# Patient Record
Sex: Male | Born: 1980 | Race: White | Hispanic: No | Smoking: Current every day smoker
Health system: Southern US, Community
[De-identification: ages and names within clinical notes are randomized; demographics above are authoritative.]

## PROBLEM LIST (undated history)

## (undated) DIAGNOSIS — F17200 Nicotine dependence, unspecified, uncomplicated: Secondary | ICD-10-CM

## (undated) DIAGNOSIS — L409 Psoriasis, unspecified: Secondary | ICD-10-CM

## (undated) DIAGNOSIS — I10 Essential (primary) hypertension: Secondary | ICD-10-CM

## (undated) DIAGNOSIS — L0211 Cutaneous abscess of neck: Secondary | ICD-10-CM

## (undated) HISTORY — PX: APPENDECTOMY: SHX54

## (undated) HISTORY — DX: Cutaneous abscess of neck: L02.11

---

## 2005-10-06 ENCOUNTER — Other Ambulatory Visit: Payer: Self-pay

## 2005-10-06 ENCOUNTER — Emergency Department: Payer: Self-pay | Admitting: Emergency Medicine

## 2005-10-06 ENCOUNTER — Ambulatory Visit: Payer: Self-pay | Admitting: Emergency Medicine

## 2006-04-23 ENCOUNTER — Emergency Department: Payer: Self-pay | Admitting: Emergency Medicine

## 2006-06-06 ENCOUNTER — Emergency Department: Payer: Self-pay | Admitting: General Practice

## 2007-06-14 ENCOUNTER — Ambulatory Visit: Payer: Self-pay

## 2008-08-01 ENCOUNTER — Ambulatory Visit: Payer: Self-pay | Admitting: Family Medicine

## 2009-08-02 IMAGING — CR DG LUMBAR SPINE COMPLETE 4+V
1 series · 5 of 5 positions shown · non-contrast
Comparison: none

REASON FOR EXAM: pain without trauma
COMMENTS:

[Series 1: view not recorded · 0.17mm/px · 5 of 5 slices shown]
[im 1/5]
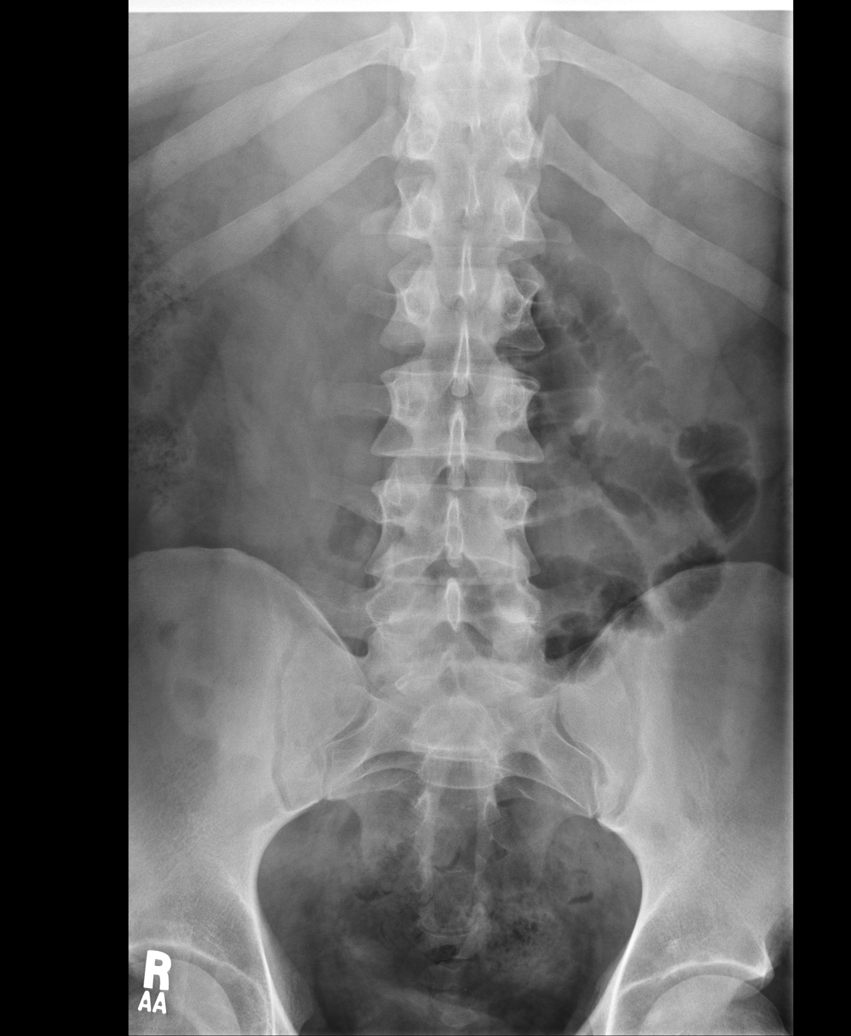
[im 2/5]
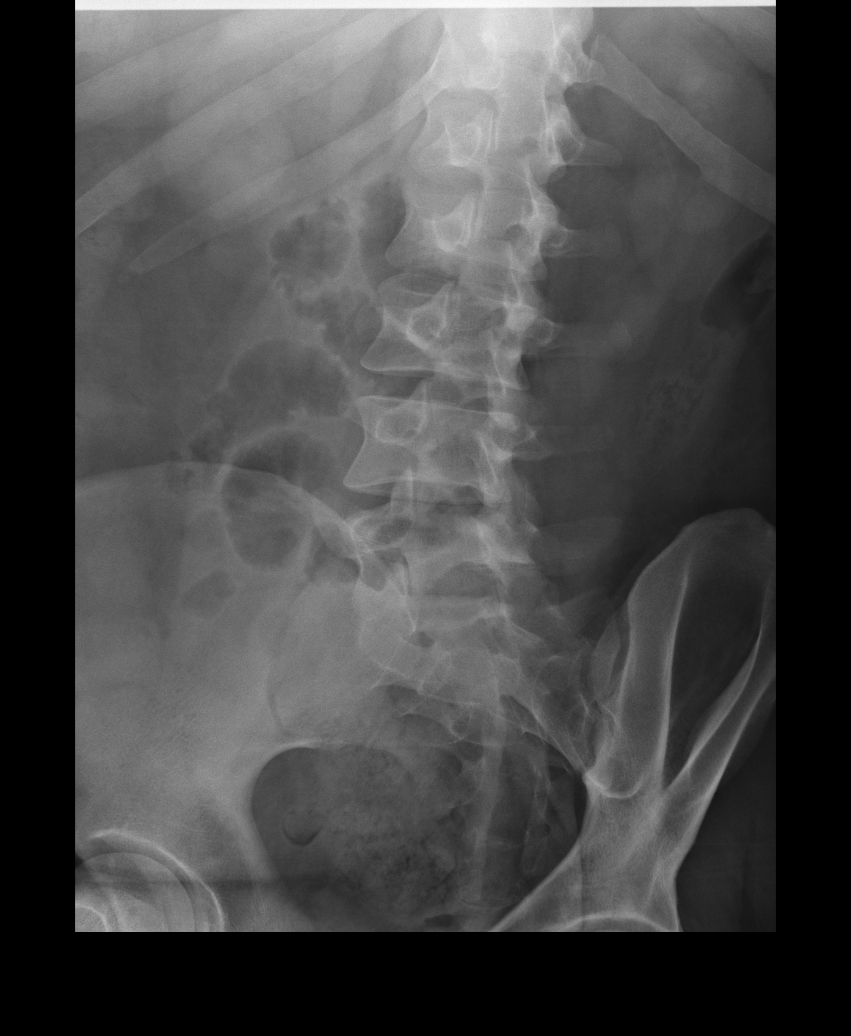
[im 3/5]
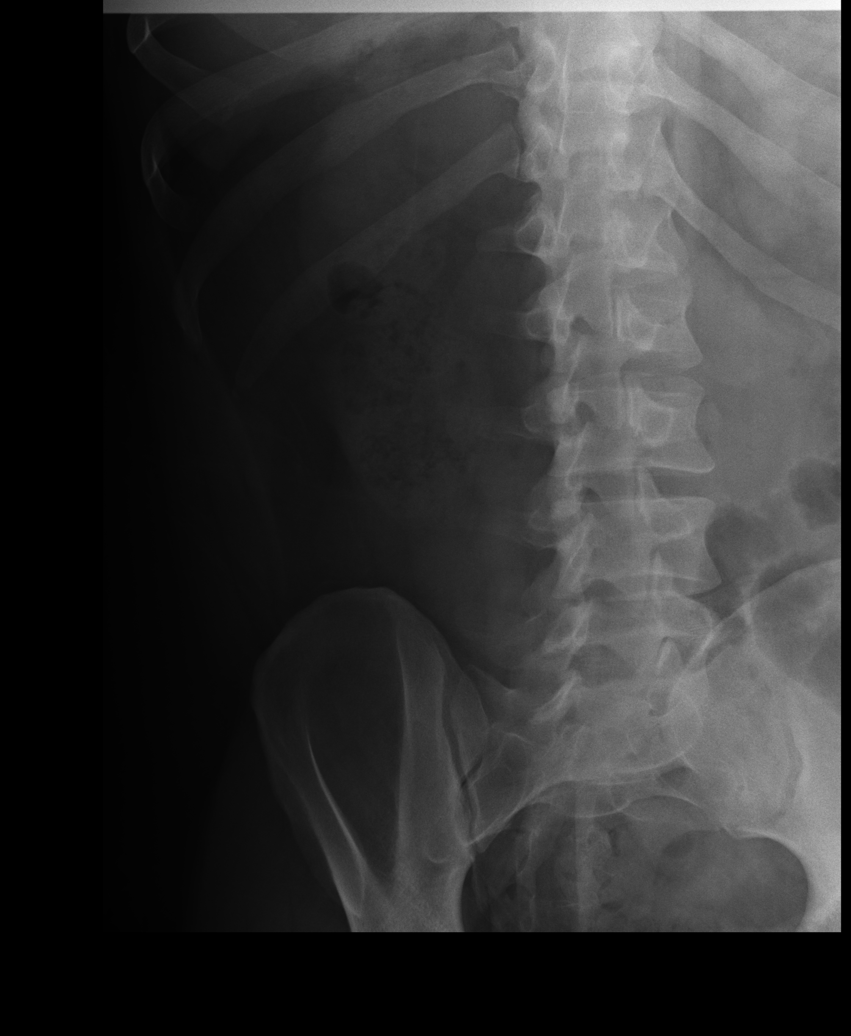
[im 4/5]
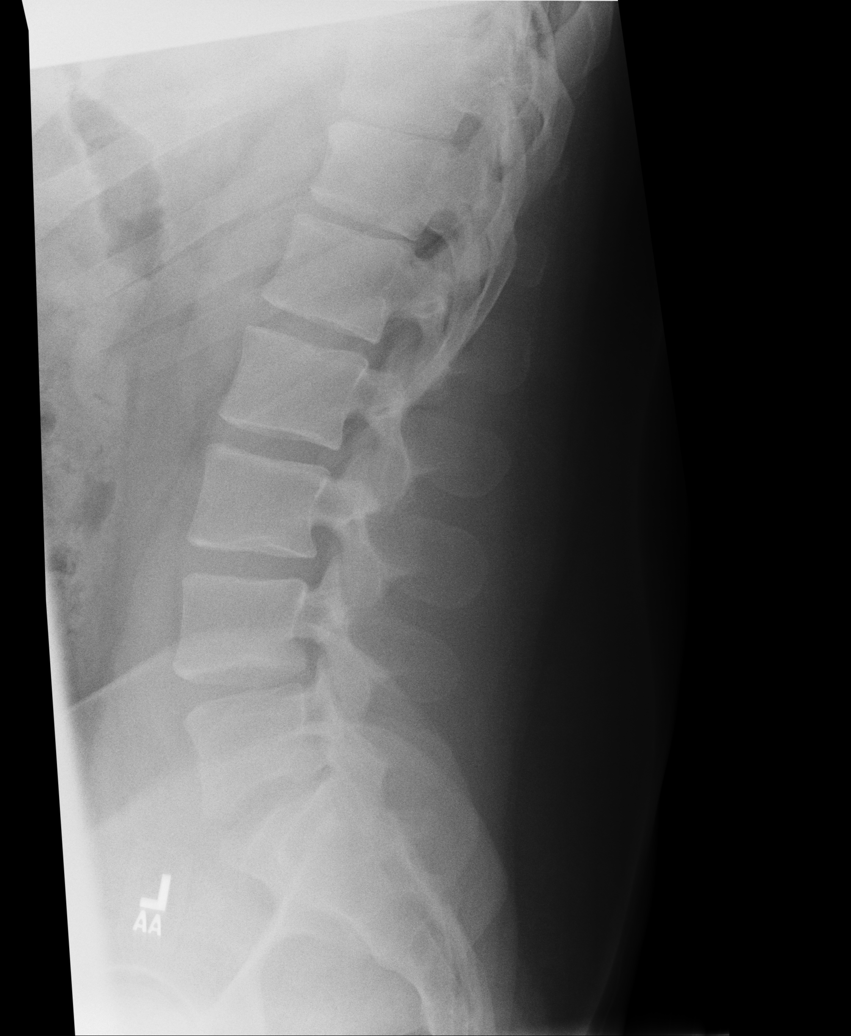
[im 5/5]
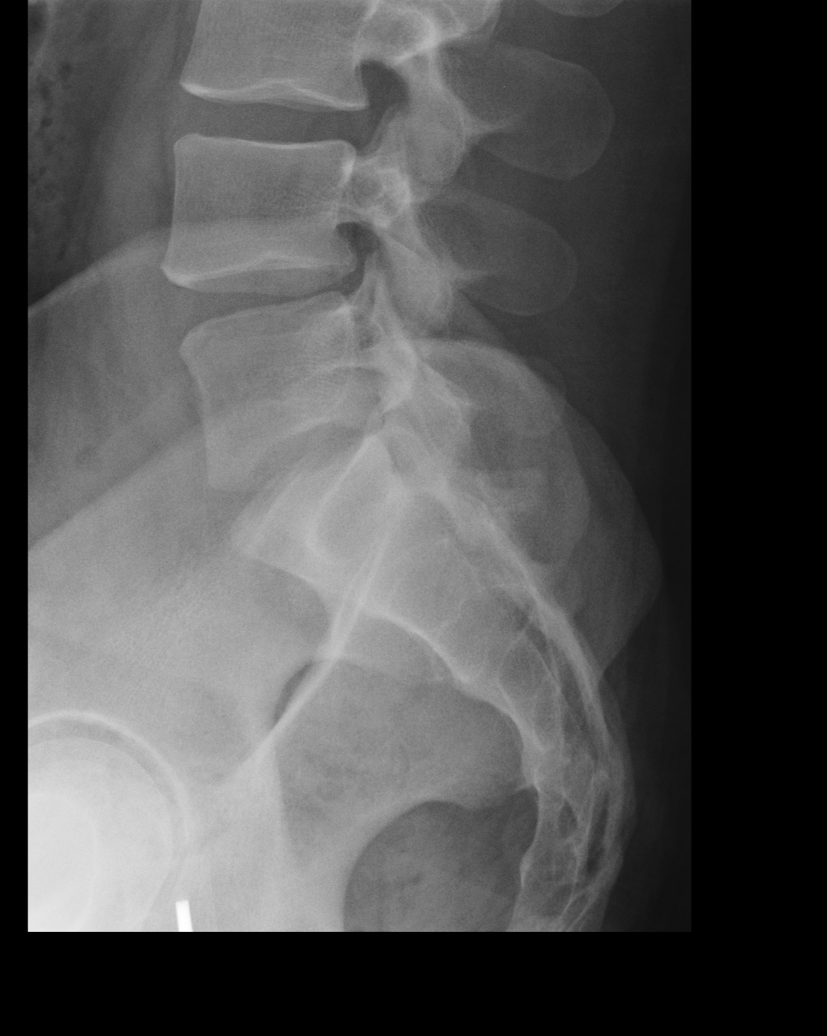

[5 of 5 positions shown; findings below may reference images not displayed]

PROCEDURE:     MDR - M[REDACTED] SPINE WITH OBLIQUES  - August 01, 2008 [DATE]

RESULT:     The vertebral body heights are well maintained. No fracture is
seen. The vertebral body alignment is normal. There is slight narrowing of
the L4-L5 intervertebral disc space. No associated spur formation or
sclerosis of the adjacent vertebral plates is seen. The narrowing could
either be developmental or secondary to early manifestation of disc disease.
This could be further evaluated by MR if such is clinically indicated.
Oblique views show no significant abnormalities of the articular facets. The
pedicles are bilaterally intact.
IMPRESSION: 1. No fracture or other acute bony abnormality is seen.
2. There is slight nonspecific narrowing of the L4-L5 intervertebral disc
space as noted above.

## 2010-01-19 ENCOUNTER — Ambulatory Visit: Payer: Self-pay | Admitting: Family Medicine

## 2011-04-05 ENCOUNTER — Emergency Department: Payer: Self-pay | Admitting: Emergency Medicine

## 2012-01-17 ENCOUNTER — Observation Stay: Payer: Self-pay | Admitting: Internal Medicine

## 2012-01-17 LAB — MAGNESIUM: Magnesium: 1.8 mg/dL

## 2012-01-17 LAB — COMPREHENSIVE METABOLIC PANEL
Alkaline Phosphatase: 92 U/L (ref 50–136)
Anion Gap: 5 — ABNORMAL LOW (ref 7–16)
BUN: 14 mg/dL (ref 7–18)
Bilirubin,Total: 0.2 mg/dL (ref 0.2–1.0)
Calcium, Total: 9.3 mg/dL (ref 8.5–10.1)
Chloride: 106 mmol/L (ref 98–107)
Co2: 30 mmol/L (ref 21–32)
EGFR (Non-African Amer.): >60
Osmolality: 283 (ref 275–301)
SGOT(AST): 23 U/L (ref 15–37)
Sodium: 141 mmol/L (ref 136–145)

## 2012-01-17 LAB — HEMOGLOBIN A1C: Hemoglobin A1C: 6 % (ref 4.2–6.3)

## 2012-01-17 LAB — CBC
HCT: 47.4 % (ref 40.0–52.0)
MCHC: 33.4 g/dL (ref 32.0–36.0)
MCV: 85 fL (ref 80–100)
Platelet: 321 10*3/uL (ref 150–440)
RBC: 5.61 10*6/uL (ref 4.40–5.90)
RDW: 14.7 % — ABNORMAL HIGH (ref 11.5–14.5)
WBC: 11.2 10*3/uL — ABNORMAL HIGH (ref 3.8–10.6)

## 2012-01-17 LAB — TSH: Thyroid Stimulating Horm: 1.51 u[IU]/mL

## 2012-01-18 LAB — CBC WITH DIFFERENTIAL/PLATELET
Basophil %: 0.4 %
Eosinophil %: 3.3 %
HGB: 15 g/dL (ref 13.0–18.0)
Lymphocyte #: 4.1 10*3/uL — ABNORMAL HIGH (ref 1.0–3.6)
MCH: 28.1 pg (ref 26.0–34.0)
MCV: 84 fL (ref 80–100)
Monocyte #: 1 10*3/uL — ABNORMAL HIGH (ref 0.0–0.7)
Monocyte %: 8.5 %
Neutrophil #: 6.5 10*3/uL (ref 1.4–6.5)
Neutrophil %: 53.7 %
RBC: 5.34 10*6/uL (ref 4.40–5.90)
WBC: 12.1 10*3/uL — ABNORMAL HIGH (ref 3.8–10.6)

## 2012-01-18 LAB — BASIC METABOLIC PANEL
Anion Gap: 12 (ref 7–16)
Calcium, Total: 8.9 mg/dL (ref 8.5–10.1)
Chloride: 108 mmol/L — ABNORMAL HIGH (ref 98–107)
Co2: 25 mmol/L (ref 21–32)
Creatinine: 1.2 mg/dL (ref 0.60–1.30)
EGFR (African American): >60
Osmolality: 290 (ref 275–301)

## 2012-01-18 LAB — LIPID PANEL
Cholesterol: 176 mg/dL (ref 0–200)
HDL Cholesterol: 19 mg/dL — ABNORMAL LOW (ref 40–60)
Ldl Cholesterol, Calc: 110 mg/dL — ABNORMAL HIGH (ref 0–100)
Triglycerides: 236 mg/dL — ABNORMAL HIGH (ref 0–200)

## 2012-01-18 LAB — CK-MB: CK-MB: 0.9 ng/mL (ref 0.5–3.6)

## 2012-12-09 ENCOUNTER — Emergency Department: Payer: Self-pay | Admitting: Emergency Medicine

## 2012-12-13 ENCOUNTER — Ambulatory Visit: Payer: Self-pay

## 2012-12-13 LAB — RAPID INFLUENZA A&B ANTIGENS

## 2013-01-17 IMAGING — CR DG CHEST 1V PORT
1 series · 1 of 1 positions shown · non-contrast
Comparison: none

REASON FOR EXAM: cp
COMMENTS:

PROCEDURE:     DXR - DXR PORTABLE CHEST SINGLE VIEW  - January 17, 2012  [DATE]
RESULT:     The lungs are mildly hypoinflated. There is no focal infiltrate.
The cardiac silhouette is top normal in size. The pulmonary vascularity is
not engorged. The mediastinum does not appear abnormally widened.

[portable]
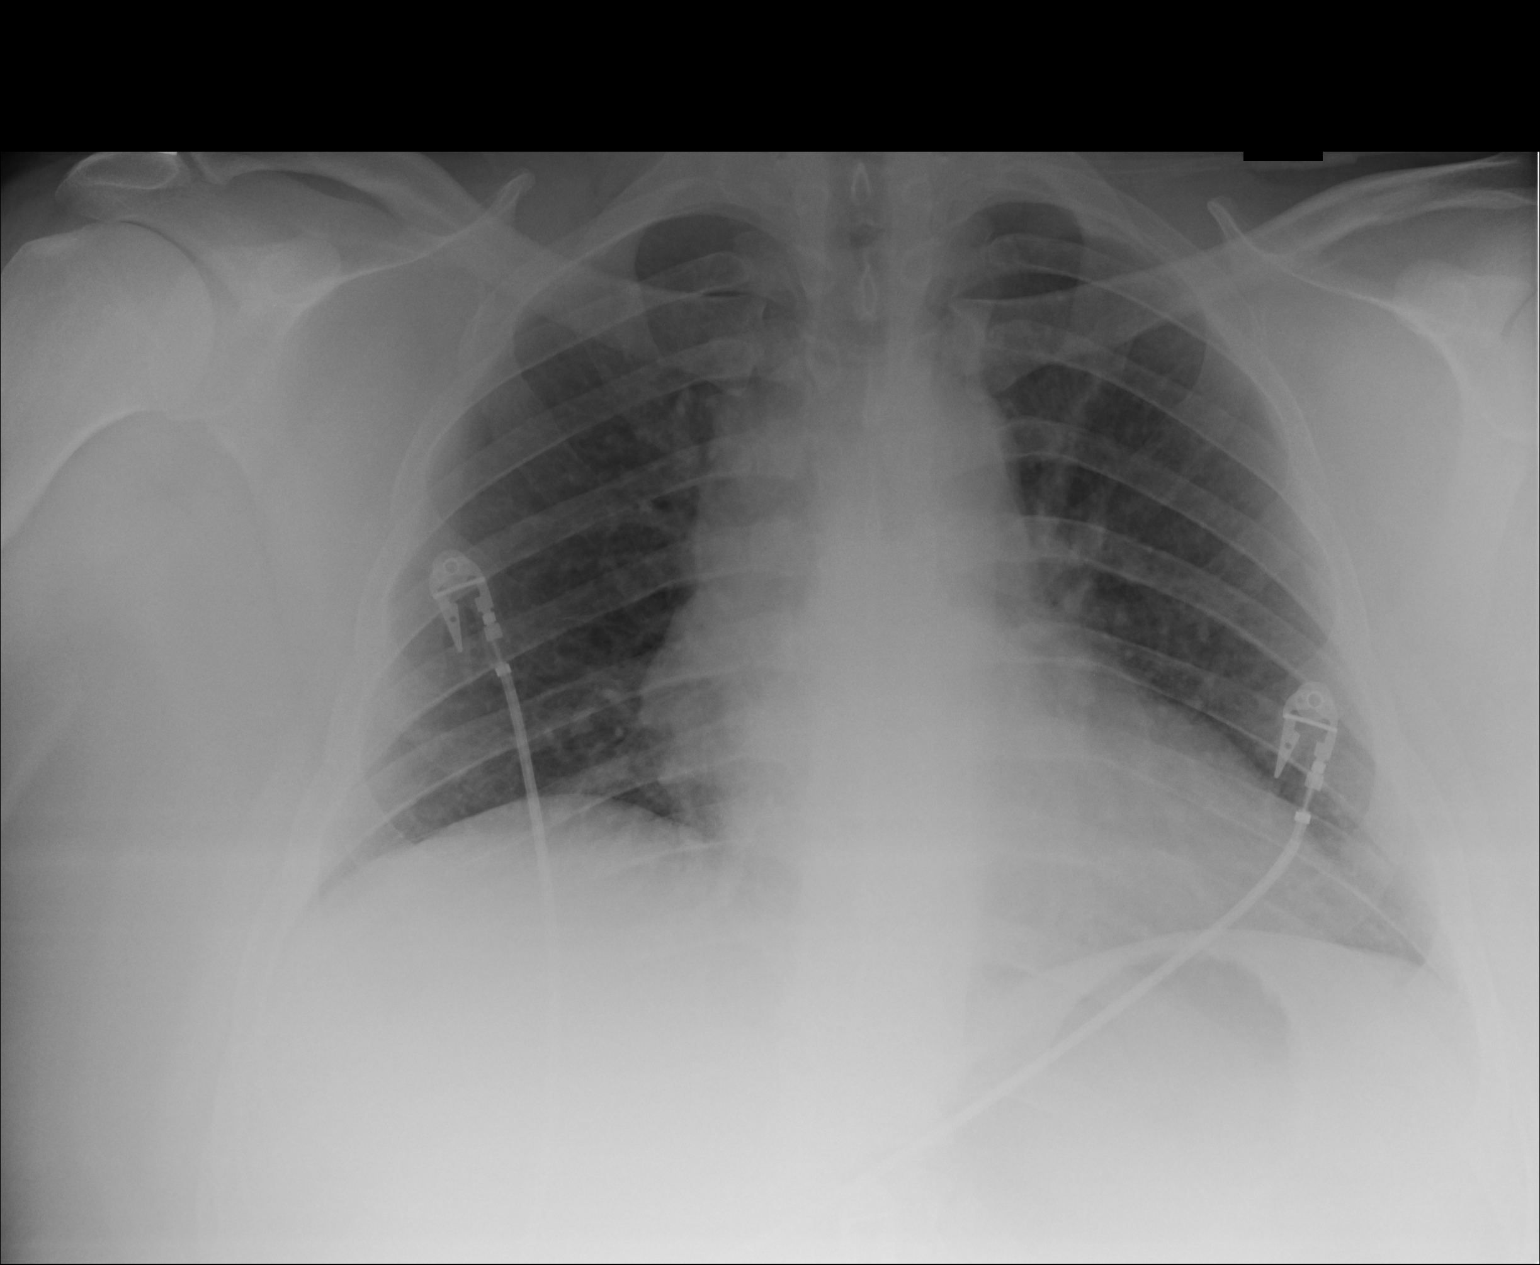

[1 of 1 positions shown; findings below may reference images not displayed]

IMPRESSION: There is mild pulmonary hypoinflation. I do not see
definite evidence of pulmonary edema or other acute cardiopulmonary
abnormality. A followup PA and lateral chest x-ray would be of value if the
patient's symptoms persist.

## 2013-12-14 IMAGING — CR DG CHEST 2V
1 series · 2 of 2 positions shown · non-contrast
Comparison: none

REASON FOR EXAM: cough for 2 weeks
COMMENTS:

[Series 1: pa · 0.17mm/px · 2 of 2 slices shown]
[im 1/2]
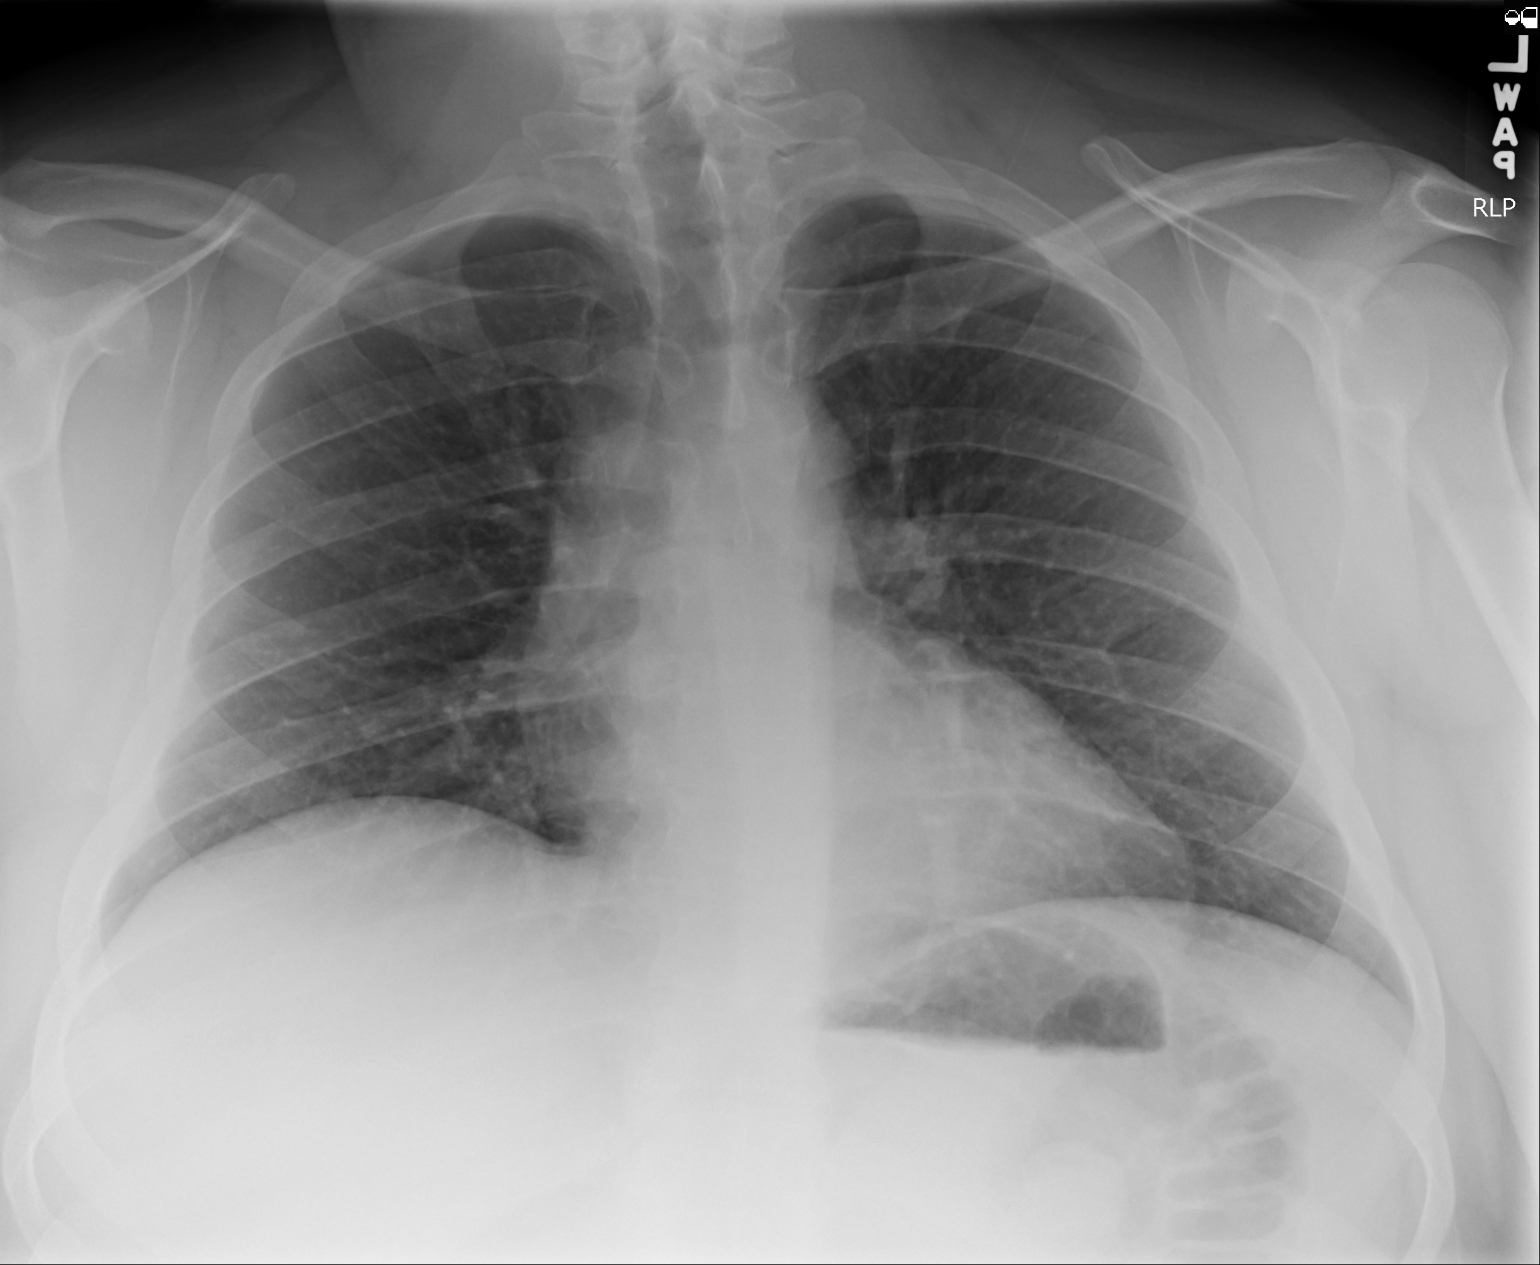
[im 2/2]
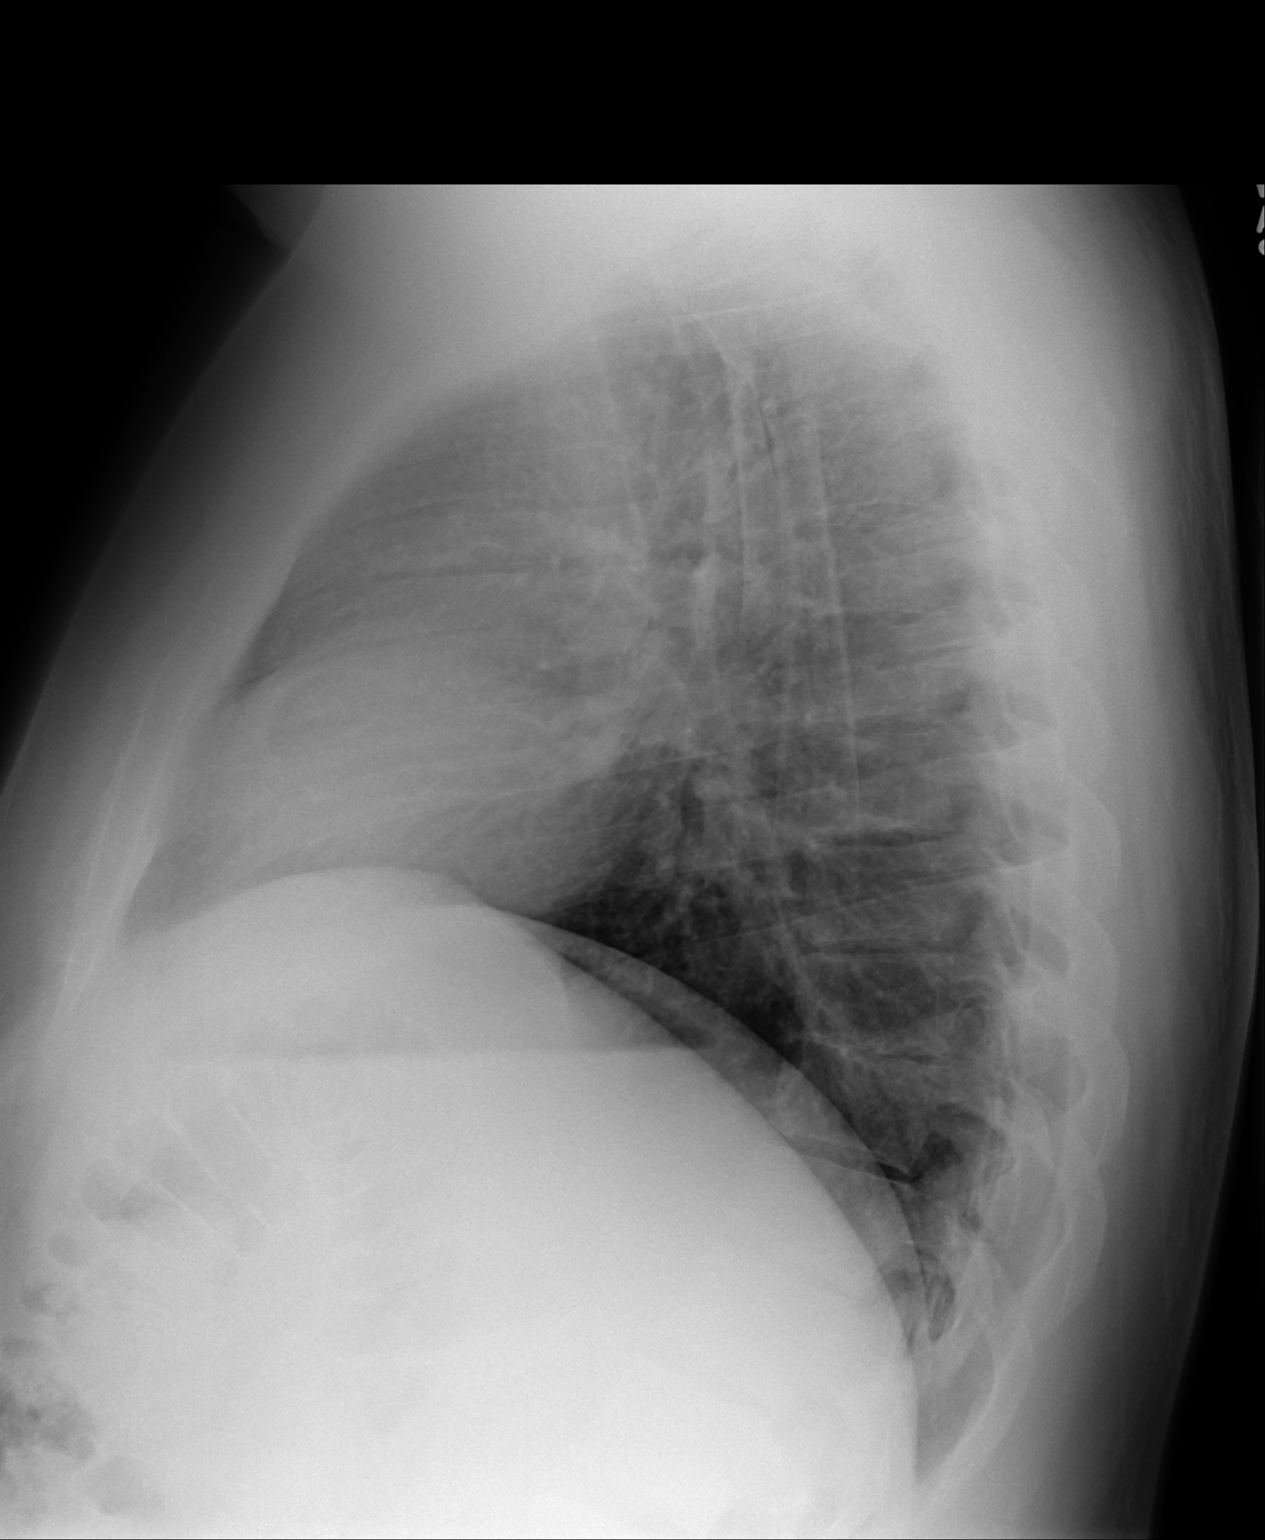

[2 of 2 positions shown; findings below may reference images not displayed]

PROCEDURE:     MDR - MDR CHEST PA(OR AP) AND LATERAL  - December 13, 2012 [DATE]

RESULT:     Comparison is made to the study 17 January, 2012. The lungs
are clear. The heart and pulmonary vessels are normal. The bony and
mediastinal structures are unremarkable. There is no effusion. There is no
pneumothorax or evidence of congestive failure.
IMPRESSION: No acute cardiopulmonary disease. Stable appearance.

[REDACTED]

## 2015-02-05 ENCOUNTER — Ambulatory Visit: Payer: Self-pay | Admitting: Physician Assistant

## 2015-03-15 NOTE — H&P (Signed)
PATIENT NAME:  Brandon Hebert, Brandon Hebert MR#:  [id number] DATE OF BIRTH:  09-20-1981  DATE OF ADMISSION:  01/17/2012  PRIMARY CARE PHYSICIAN: None   HISTORY OF PRESENT ILLNESS: Patient is a 34 year old male with past medical history significant for history of cervical vertebral fracture in the past, history of appendectomy, history of hiatal hernia presented to the hospital with complaints of chest pain. According to patient he was doing well up until 2:00 p.m. on the day of admission when he was sitting and eating his lunch, suddenly started having midsternal chest pain which was described as burning intermittent pain with no radiation, increasing whenever he walked around and accompanied by shortness of breath. He denied any other symptoms, however, his pain lasted a few minutes and it would ease off and would increase again whenever he tried to walk. On arrival to the Emergency Room he was noted to have normal cardiac enzymes. His EKG was also unremarkable but because of his pain seemed to be intermittent and worsening as well as his family history of coronary artery disease hospitalist services were contacted for admission. Pain was described as pressure-type pain.  PAST MEDICAL HISTORY: 1. History of hiatal hernia. 2. Cervical vertebral fracture in the past. 3. History of appendectomy. 4. History of left lower back pain 07/2008.   MEDICATIONS: None.   PAST SURGICAL HISTORY: None.   ALLERGIES: Codeine.   FAMILY HISTORY: Significant for maternal uncle who had myocardial infarction at age of 72. Patient's mother has hypertension as well as hyperlipidemia. Both parents have asthma.   SOCIAL HISTORY: Patient is single, has no children. He used to smoke since age 58, smokes approximately half pack per day, quit two years ago, however, restarted approximately a month ago. Drinks alcohol on weekends. He works in Personal assistant.   REVIEW OF SYSTEMS: CONSTITUTIONAL: Positive for pains in his chest, snoring as  well as sleepiness in daytime, some cough with no sputum production as well as dyspnea today and chest pains earlier today. Denies any fevers, chills, fatigue, weakness, weight loss or gain. EYES: In regards to eyes, denies any blurry vision, double vision, glaucoma, o cataracts. ENT: Denies any tinnitus, allergies, epistaxis, sinus pain, dentures, difficulty swallowing. RESPIRATORY: Denies any wheezes, asthma, chronic obstructive pulmonary disease. CARDIOVASCULAR: Denies any orthopnea, edema, arrhythmias, palpitations, or syncope. GASTROINTESTINAL: Denies nausea, vomiting, diarrhea, constipation. GENITOURINARY: Denies dysuria, hematuria, frequency, incontinence. ENDOCRINOLOGY: Denies any polydipsia, nocturia, thyroid problems, heat or cold intolerance or thirst. HEMATOLOGIC: Denies anemia, easy bruising, bleeding, swollen glands. SKIN: Denies any acne, rashes, lesions, change in moles. MUSCULOSKELETAL: Denies arthritis, cramps, swelling, gout. NEUROLOGICAL: Denies any numbness, epilepsy, tremor. PSYCH: Denies anxiety, insomnia, depression.   PHYSICAL EXAMINATION:  VITAL SIGNS: On arrival to hospital: Temperature 98.2, pulse 90, respiration rate 19, blood pressure 147/80, saturation 96% on room air.   GENERAL: This is a well-developed, obese male sitting on the stretcher. He is very somnolent and are not able to communicate with me well. He drifts periodically to sleep and I have to shake him up or talk to him louder so he could wake up.  HEENT: Pupils are equal, reactive to light. Extraocular movements are intact. No icterus or conjunctivitis. Has normal hearing. No pharyngeal edema. Mucosa is moist.   NECK: Neck did not reveal any masses. Supple, nontender. Thyroid not enlarged. No adenopathy. No JVD or carotid bruits bilaterally. Full range of motion.   LUNGS: Clear to auscultation in all fields. No rales, rhonchi, diminished breath sounds or wheezing. No labored inspirations,  dullness to percussion,  overt respiratory distress.   CARDIOVASCULAR: S1, S2 appreciated. No murmurs, gallops, rubs noted. PMI not lateralized. Chest is nontender to palpation.    EXTREMITIES: 1+ pedal pulses. No lower extremity edema, calf tenderness, or cyanosis.   ABDOMEN: Soft, nontender. Bowel sounds are present. No hepatosplenomegaly or masses were noted.   RECTAL: Deferred.   MUSCULOSKELETAL: Able to move all extremities. No cyanosis, degenerative joint disease, or kyphosis. Gait is not tested.   SKIN: Skin did not reveal any rashes, lesions, erythema, nodularity, induration. It was warm and dry to palpation.   LYMPH: No adenopathy in cervical region.   NEUROLOGICAL: Cranial nerves grossly intact. Sensory is intact. No dysarthria, aphasia.   PSYCH: Patient is alert, oriented to person, place. Poorly cooperative due to significant somnolence. Memory is not impaired but no significant confusion, agitation, depression.   LABORATORY, DIAGNOSTIC AND RADIOLOGICAL DATA: BMP showed glucose 113, potassium 3.4, otherwise unremarkable study. Liver enzymes were normal. Cardiac enzymes troponin less than 0.02. TSH normal at 1.51. White blood cell count is elevated to 11.2, hemoglobin 15.8, platelets 321. D-dimer less than 0.22. EKG showed normal sinus rhythm at 89 beats per minute, normal axis, no acute ST-T changes.   ASSESSMENT AND PLAN:  1. Chest pain. Admit patient to medical floor observation. Start him on metoprolol, nitroglycerin, aspirin as well as heparin subcutaneous. Check cardiac enzymes x3 and get Myoview stress test in the morning.  2. Obesity. Check TSH which was unremarkable.  3. Lipid profile for the morning and patient would benefit from outpatient sleep study to rule out obstructive sleep apnea.  4. Hypokalemia. Supplementing p.o. Check magnesium level.  5. Hyperglycemia. Get hemoglobin A1c.  6. Leukocytosis, likely stress related.  7. Elevated blood pressure without diagnosis of hypertension. Will  continue patient on metoprolol as well as nitroglycerin topically while in the hospital. Patient may benefit from outpatient blood pressure medications as well as 2 grams salt diet.      TIME SPENT: 50 minutes.   ____________________________ Katharina Caperima Tamaya Pun, MD rv:cms D: 01/17/2012 20:21:12 ET T: 01/18/2012 06:04:27 ET JOB#: [id number]  cc: Katharina Caperima Bianca Raneri, MD, <Dictator> Milicent Acheampong MD ELECTRONICALLY SIGNED 01/18/2012 14:32

## 2015-03-15 NOTE — Discharge Summary (Signed)
PATIENT NAME:  Brandon Hebert, Brandon Hebert MR#:  [id number] DATE OF BIRTH:  31-Oct-1981   DATE OF ADMISSION:  01/17/2012 DATE OF DISCHARGE:  01/18/2012 (AGAINST MEDICAL ADVICE)   PRIMARY CARE PHYSICIAN: None.   REASON FOR ADMISSION: Chest pain.   DIAGNOSES: (At the time the patient left the hospital AGAINST MEDICAL ADVICE)  1. Chest pain of unclear etiology. The patient left the hospital AGAINST MEDICAL ADVICE before the second part of his stress test could be performed or completed.  2. Hiatal hernia.  3. Possible gastroesophageal reflux disease.  4. Hypercholesterolemia/Hypertriglyceridemia.  5. Tobacco abuse.  6. Obesity.  7. Possible obstructive sleep apnea.  8. History of appendectomy. 9. History of cervical vertebral fracture in past. 10. History of left lower back pain in September 2009. 11. Hypokalemia, resolved.  12. Leukocytosis of unclear etiology.   CONSULTATIONS: None.   LABORATORY, DIAGNOSTIC AND RADIOLOGICAL DATA:  Portable chest x-ray on 01/17/2012: Mild pulmonary hypoinflation. No evidence of acute cardiopulmonary abnormalities are noted.  Preliminary report from Part 1 of the patient's treadmill Myoview: Negative ETT with fair exercise tolerance. LV function mildly reduced at 47% with borderline septal hypokinesis. Stress images revealed no areas of hypoperfusion. Rest images were not done secondary to the patient leaving the hospital AGAINST MEDICAL ADVICE.   EKG on admission: Normal sinus rhythm. Normal heart rate, normal intervals, normal axis. No acute ST or T wave changes.  D-dimer less than 0.22.  Cardiac enzymes are negative x3 sets.  TSH 1.51 on admission.  CBC in admission: WBC 11.3, hemoglobin 15.8, hematocrit 47.4, platelets 321.  LFTs normal on admission.  Renal function normal on admission and discharge. Discharge creatinine 1.20.  Serum potassium 3.4 on admission and 4 on the day of discharge.   BRIEF HISTORY/HOSPITAL COURSE: The patient is a 34 year old obese  male with past medical history of cervical vertebral fracture back pain, tobacco abuse, obesity, hiatal hernia, presents to the Emergency Department  with complaints of chest pain. Please see the dictated History and Physical for pertinent details surrounding the onset of hospitalization. Please see below for further details.   1. Chest pain: Atypical in nature which came on with eating, described as a burning sensation. The patient was admitted to the Observation Unit for further evaluation of his chest pain. He was initially started on nitroglycerin and aspirin therapy. First troponin was negative. He was also given some pain control. With conservative measures, the patient's chest pain had eventually resolved. He has ruled out for a myocardial infarction based on three negative sets of cardiac enzymes. Thereafter, he was scheduled for a Myoview. The patient underwent Part 1 of the Myoview, and given his body habitus the second part could not be completed until the next day. However, the patient decided to leave the hospital AGAINST MEDICAL ADVICE prior to having the second part of the Myoview performed. The exact etiology of his chest pain is unclear. It could be related to his hiatal hernia and possible gastroesophageal reflux disease as it came on with eating; and I had advised him before he left the hospital AGAINST MEDICAL ADVICE that he should follow up with Gastroenterology as an outpatient and start proton pump inhibitor therapy.  2. Hypercholesterolemia/hypertriglyceridemia: The patient wished to start a trial of diet and exercise control before being started on medications.  3. Tobacco abuse: The patient was strongly counseled on the importance of smoking cessation. 4. Leukocytosis: Of unclear etiology, could be stress-related, however, his WBC count slightly went up prior to discharge.  There were no obvious signs of infection. The patient left the hospital AGAINST MEDICAL ADVICE before this could  be further worked up, and the exact etiology of his leukocytosis is unclear at this time whether this is stress-related versus possibly secondary to an underlying infection.  5. Obesity with obstructive sleep apnea: The patient was advised that he would likely benefit from outpatient referral to Pulmonology for a sleep study but left the hospital AGAINST MEDICAL ADVICE prior to having this arranged.     On 01/18/2012, the patient left the hospital AGAINST MEDICAL ADVICE.    ____________________________ Elon AlasKamran N. Bricia Taher, MD knl:cbb D: 01/21/2012 16:50:35 ET T: 01/22/2012 14:42:19 ET JOB#: [id number]  cc: Elon AlasKamran N. Carlye Panameno, MD, <Dictator> [facility] Elon AlasKAMRAN N Isiah Scheel MD ELECTRONICALLY SIGNED 01/25/2012 21:06

## 2015-05-07 ENCOUNTER — Encounter: Payer: Self-pay | Admitting: Emergency Medicine

## 2015-05-07 ENCOUNTER — Emergency Department
Admission: EM | Admit: 2015-05-07 | Discharge: 2015-05-07 | Disposition: A | Discharge status: Home / Self Care | Payer: No Typology Code available for payment source | Attending: Emergency Medicine | Admitting: Emergency Medicine

## 2015-05-07 DIAGNOSIS — Y9389 Activity, other specified: Secondary | ICD-10-CM | POA: Diagnosis not present

## 2015-05-07 DIAGNOSIS — Y9289 Other specified places as the place of occurrence of the external cause: Secondary | ICD-10-CM | POA: Diagnosis not present

## 2015-05-07 DIAGNOSIS — Y998 Other external cause status: Secondary | ICD-10-CM | POA: Insufficient documentation

## 2015-05-07 DIAGNOSIS — S86912A Strain of unspecified muscle(s) and tendon(s) at lower leg level, left leg, initial encounter: Secondary | ICD-10-CM | POA: Diagnosis not present

## 2015-05-07 DIAGNOSIS — Z880 Allergy status to penicillin: Secondary | ICD-10-CM | POA: Insufficient documentation

## 2015-05-07 DIAGNOSIS — M25462 Effusion, left knee: Secondary | ICD-10-CM | POA: Insufficient documentation

## 2015-05-07 DIAGNOSIS — Z720 Tobacco use: Secondary | ICD-10-CM | POA: Insufficient documentation

## 2015-05-07 DIAGNOSIS — M25562 Pain in left knee: Secondary | ICD-10-CM | POA: Diagnosis present

## 2015-05-07 DIAGNOSIS — X58XXXA Exposure to other specified factors, initial encounter: Secondary | ICD-10-CM | POA: Insufficient documentation

## 2015-05-07 MED ORDER — KETOROLAC TROMETHAMINE 10 MG PO TABS: 10.0000 mg | ORAL_TABLET | Freq: Three times a day (TID) | ORAL | Status: DC

## 2015-05-07 NOTE — Discharge Instructions (Signed)
Knee Effusion The medical term for having fluid in your knee is effusion. This is often due to an internal derangement of the knee. This means something is wrong inside the knee. Some of the causes of fluid in the knee may be torn cartilage, a torn ligament, or bleeding into the joint from an injury. Your knee is likely more difficult to bend and move. This is often because there is increased pain and pressure in the joint. The time it takes for recovery from a knee effusion depends on different factors, including:   Type of injury.  Your age.  Physical and medical conditions.  Rehabilitation Strategies. How long you will be away from your normal activities will depend on what kind of knee problem you have and how much damage is present. Your knee has two types of cartilage. Articular cartilage covers the bone ends and lets your knee bend and move smoothly. Two menisci, thick pads of cartilage that form a rim inside the joint, help absorb shock and stabilize your knee. Ligaments bind the bones together and support your knee joint. Muscles move the joint, help support your knee, and take stress off the joint itself. CAUSES  Often an effusion in the knee is caused by an injury to one of the menisci. This is often a tear in the cartilage. Recovery after a meniscus injury depends on how much meniscus is damaged and whether you have damaged other knee tissue. Small tears may heal on their own with conservative treatment. Conservative means rest, limited weight bearing activity and muscle strengthening exercises. Your recovery may take up to 6 weeks.  TREATMENT  Larger tears may require surgery. Meniscus injuries may be treated during arthroscopy. Arthroscopy is a procedure in which your surgeon uses a small telescope like instrument to look in your knee. Your caregiver can make a more accurate diagnosis (learning what is wrong) by performing an arthroscopic procedure. If your injury is on the inner margin  of the meniscus, your surgeon may trim the meniscus back to a smooth rim. In other cases your surgeon will try to repair a damaged meniscus with stitches (sutures). This may make rehabilitation take longer, but may provide better long term result by helping your knee keep its shock absorption capabilities. Ligaments which are completely torn usually require surgery for repair. HOME CARE INSTRUCTIONS  Use crutches as instructed.  If a brace is applied, use as directed.  Once you are home, an ice pack applied to your swollen knee may help with discomfort and help decrease swelling.  Keep your knee raised (elevated) when you are not up and around or on crutches.  Only take over-the-counter or prescription medicines for pain, discomfort, or fever as directed by your caregiver.  Your caregivers will help with instructions for rehabilitation of your knee. This often includes strengthening exercises.  You may resume a normal diet and activities as directed. SEEK MEDICAL CARE IF:   There is increased swelling in your knee.  You notice redness, swelling, or increasing pain in your knee.  An unexplained oral temperature above 102 F (38.9 C) develops. SEEK IMMEDIATE MEDICAL CARE IF:   You develop a rash.  You have difficulty breathing.  You have any allergic reactions from medications you may have been given.  There is severe pain with any motion of the knee. MAKE SURE YOU:   Understand these instructions.  Will watch your condition.  Will get help right away if you are not doing well or get worse.  Document Released: 01/28/2004 Document Revised: 01/30/2012 Document Reviewed: 04/02/2008 [facility] Patient Information 2015 [place], [place]. This information is not intended to replace advice given to you by your health care provider. Make sure you discuss any questions you have with your health care provider.  Knee Sprain A knee sprain is a tear in one of the strong, fibrous tissues that  connect the bones (ligaments) in your knee. The severity of the sprain depends on how much of the ligament is torn. The tear can be either partial or complete. CAUSES  Often, sprains are a result of a fall or injury. The force of the impact causes the fibers of your ligament to stretch too much. This excess tension causes the fibers of your ligament to tear. SIGNS AND SYMPTOMS  You may have some loss of motion in your knee. Other symptoms include:  Bruising.  Pain in the knee area.  Tenderness of the knee to the touch.  Swelling. DIAGNOSIS  To diagnose a knee sprain, your health care provider will physically examine your knee. Your health care provider may also suggest an X-ray exam of your knee to make sure no bones are broken. TREATMENT  If your ligament is only partially torn, treatment usually involves keeping the knee in a fixed position (immobilization) or bracing your knee for activities that require movement for several weeks. To do this, your health care provider will apply a bandage, cast, or splint to keep your knee from moving and to support your knee during movement until it heals. For a partially torn ligament, the healing process usually takes 4-6 weeks. If your ligament is completely torn, depending on which ligament it is, you may need surgery to reconnect the ligament to the bone or reconstruct it. After surgery, a cast or splint may be applied and will need to stay on your knee for 4-6 weeks while your ligament heals. HOME CARE INSTRUCTIONS  Keep your injured knee elevated to decrease swelling.  To ease pain and swelling, apply ice to the injured area:  Put ice in a plastic bag.  Place a towel between your skin and the bag.  Leave the ice on for 20 minutes, 2-3 times a day.  Only take medicine for pain as directed by your health care provider.  Do not leave your knee unprotected until pain and stiffness go away (usually 4-6 weeks).  If you have a cast or splint,  do not allow it to get wet. If you have been instructed not to remove it, cover it with a plastic bag when you shower or bathe. Do not swim.  Your health care provider may suggest exercises for you to do during your recovery to prevent or limit permanent weakness and stiffness. SEEK IMMEDIATE MEDICAL CARE IF:  Your cast or splint becomes damaged.  Your pain becomes worse.  You have significant pain, swelling, or numbness below the cast or splint. MAKE SURE YOU:  Understand these instructions.  Will watch your condition.  Will get help right away if you are not doing well or get worse. Document Released: 11/07/2005 Document Revised: 08/28/2013 Document Reviewed: 06/19/2013 [facility] Patient Information 2015 [place], [place]. This information is not intended to replace advice given to you by your health care provider. Make sure you discuss any questions you have with your health care provider.  Rest, elevate, and ice the knee to resolve effusion.  Use the ace bandage as needed for support.  Follow-up with Dr. Ernest Pine for ongoing symptoms.

## 2015-05-07 NOTE — ED Notes (Signed)
States he developed pain to left 2 days ago. Noticed some swelling this am while at work this am . Ambulates with slight limp.  Denies any injury

## 2015-05-07 NOTE — ED Provider Notes (Signed)
[facility] Emergency Department Provider Note ____________________________________________  Time seen: 0713  I have reviewed the triage vital signs and the nursing notes.  HISTORY  Chief Complaint Knee Pain  HPI Brandon Hebert. is a 34 y.o. male reports to the ED with complaints of swelling to the left knee. He describes sudden onset of swelling to the left knee after finishing his third shift last night. He denies known injury or trauma to the knee he works as a Estate agent, and notes that the prolonged sitting and climbing into the equipment is aggravated to his knee. He does describe a previous episode of sudden swelling to the same knee earlier this year, but the symptoms resolved spontaneously in a few days. He rates his pain at a 2 out of 10.  History reviewed. No pertinent past medical history.  There are no active problems to display for this patient.  Past Surgical History  Procedure Laterality Date  . Appendectomy    . Hernia repair     Current Outpatient Rx  Name  Route  Sig  Dispense  Refill  . ketorolac (TORADOL) 10 MG tablet   Oral   Take 1 tablet (10 mg total) by mouth every 8 (eight) hours.   15 tablet   0     Allergies Codeine and Penicillins  No family history on file.  Social History History  Substance Use Topics  . Smoking status: Current Every Day Smoker -- 0.50 packs/day    Types: Cigarettes  . Smokeless tobacco: Not on file  . Alcohol Use: No   Review of Systems  Constitutional: Negative for fever. Eyes: Negative for visual changes. ENT: Negative for sore throat. Cardiovascular: Negative for chest pain. Respiratory: Negative for shortness of breath. Gastrointestinal: Negative for abdominal pain, vomiting and diarrhea. Genitourinary: Negative for dysuria. Musculoskeletal: Negative for back pain. Left knee swelling. Skin: Negative for rash. Neurological: Negative for headaches, focal weakness or  numbness. ____________________________________________  PHYSICAL EXAM:  VITAL SIGNS: ED Triage Vitals  Enc Vitals Group     BP 05/07/15 0701 129/83 mmHg     Pulse Rate 05/07/15 0701 90     Resp 05/07/15 0701 18     Temp 05/07/15 0701 98.1 F (36.7 C)     Temp Source 05/07/15 0701 Oral     SpO2 05/07/15 0701 99 %     Weight 05/07/15 0701 292 lb (132.45 kg)     Height 05/07/15 0701 6\' 4"  (1.93 m)     Head Cir --      Peak Flow --      Pain Score 05/07/15 0658 2     Pain Loc --      Pain Edu? --      Excl. in GC? --    Constitutional: Alert and oriented. Well appearing and in no distress. HEENT: Normocephalic and atraumatic. Conjunctivae are normal. PERRL. Normal extraocular movements. Mucous membranes are moist. Respiratory: Normal respiratory effort. Musculoskeletal: Left knee without deformity but small effusion noted. Decreased left knee flexion secondary to quad insertion pain. Decreased extension due to effusion. Negative drawer. No valgus or varus joint stress.  Neurologic:  Normal speech and language. No gross focal neurologic deficits are appreciated. Skin:  Skin is warm, dry and intact. No rash noted. Psychiatric: Mood and affect are normal. Patient exhibits appropriate insight and judgment. ___________________________________________  PROCEDURES  Ace bandage left knee. ____________________________________________  INITIAL IMPRESSION / ASSESSMENT AND PLAN / ED COURSE  Left knee strain with  effusion. Toradol for pain and inflammation. Work note for [employer]. Referral to ortho for ongoing problems.  ____________________________________________  FINAL CLINICAL IMPRESSION(S) / ED DIAGNOSES  Final diagnoses:  Knee strain, left, initial encounter  Knee effusion, left     Lissa Hoard, PA-C 05/07/15 0741  Sharyn Creamer, MD 05/07/15 1551

## 2015-05-07 NOTE — ED Notes (Signed)
Patient ambulatory to triage with steady gait, without difficulty or distress noted; pt reports swelling to left knee; noted pain since getting off work yesterday; denies any known injury

## 2016-03-30 ENCOUNTER — Emergency Department
Admission: EM | Admit: 2016-03-30 | Discharge: 2016-03-30 | Disposition: A | Discharge status: Home / Self Care | Payer: No Typology Code available for payment source | Attending: Emergency Medicine | Admitting: Emergency Medicine

## 2016-03-30 DIAGNOSIS — R109 Unspecified abdominal pain: Secondary | ICD-10-CM | POA: Insufficient documentation

## 2016-03-30 DIAGNOSIS — Z791 Long term (current) use of non-steroidal anti-inflammatories (NSAID): Secondary | ICD-10-CM | POA: Insufficient documentation

## 2016-03-30 DIAGNOSIS — F17210 Nicotine dependence, cigarettes, uncomplicated: Secondary | ICD-10-CM | POA: Insufficient documentation

## 2016-03-30 LAB — COMPREHENSIVE METABOLIC PANEL
ALT: 21 U/L (ref 17–63)
AST: 20 U/L (ref 15–41)
Albumin: 4.5 g/dL (ref 3.5–5.0)
Alkaline Phosphatase: 70 U/L (ref 38–126)
Anion gap: 8 (ref 5–15)
BUN: 16 mg/dL (ref 6–20)
CHLORIDE: 106 mmol/L (ref 101–111)
CO2: 27 mmol/L (ref 22–32)
Calcium: 9.3 mg/dL (ref 8.9–10.3)
Creatinine, Ser: 1.27 mg/dL — ABNORMAL HIGH (ref 0.61–1.24)
Glucose, Bld: 82 mg/dL (ref 65–99)
POTASSIUM: 3.9 mmol/L (ref 3.5–5.1)
SODIUM: 141 mmol/L (ref 135–145)
Total Bilirubin: 0.4 mg/dL (ref 0.3–1.2)
Total Protein: 7.8 g/dL (ref 6.5–8.1)

## 2016-03-30 LAB — URINALYSIS COMPLETE WITH MICROSCOPIC (ARMC ONLY)
BACTERIA UA: NONE SEEN
Bilirubin Urine: NEGATIVE
GLUCOSE, UA: NEGATIVE mg/dL
HGB URINE DIPSTICK: NEGATIVE
Ketones, ur: NEGATIVE mg/dL
LEUKOCYTES UA: NEGATIVE
Nitrite: NEGATIVE
PROTEIN: NEGATIVE mg/dL
RBC / HPF: NONE SEEN RBC/hpf (ref 0–5)
SPECIFIC GRAVITY, URINE: 1.017 (ref 1.005–1.030)
SQUAMOUS EPITHELIAL / LPF: NONE SEEN
WBC UA: NONE SEEN WBC/hpf (ref 0–5)
pH: 5 (ref 5.0–8.0)

## 2016-03-30 LAB — CBC
HEMATOCRIT: 49 % (ref 40.0–52.0)
HEMOGLOBIN: 16.3 g/dL (ref 13.0–18.0)
MCH: 28.2 pg (ref 26.0–34.0)
MCHC: 33.3 g/dL (ref 32.0–36.0)
MCV: 84.8 fL (ref 80.0–100.0)
Platelets: 275 10*3/uL (ref 150–440)
RBC: 5.78 MIL/uL (ref 4.40–5.90)
RDW: 14.3 % (ref 11.5–14.5)
WBC: 14.5 10*3/uL — AB (ref 3.8–10.6)

## 2016-03-30 LAB — LIPASE, BLOOD: LIPASE: 29 U/L (ref 11–51)

## 2016-03-30 MED ORDER — TRAMADOL HCL 50 MG PO TABS
50.0000 mg | ORAL_TABLET | Freq: Four times a day (QID) | ORAL | Status: AC | PRN
Start: 2016-03-30 — End: 2017-03-30

## 2016-03-30 NOTE — ED Notes (Signed)
Pt in with co left sided abd pain since 0300 no n.v.d.

## 2016-03-30 NOTE — Discharge Instructions (Signed)
Please take your pain medication as needed for discomfort, as written. As we discussed if your pain does not improve, or worsens over the next 24 hours or you develop a fever or any other symptom personally concerning to yourself please return to the emergency department for further evaluation and consideration of a CT scan. Otherwise please follow-up with your primary care physician in one to 2 days for recheck/reevaluation.   Abdominal Pain, Adult Many things can cause abdominal pain. Usually, abdominal pain is not caused by a disease and will improve without treatment. It can often be observed and treated at home. Your health care provider will do a physical exam and possibly order blood tests and X-rays to help determine the seriousness of your pain. However, in many cases, more time must pass before a clear cause of the pain can be found. Before that point, your health care provider may not know if you need more testing or further treatment. HOME CARE INSTRUCTIONS Monitor your abdominal pain for any changes. The following actions may help to alleviate any discomfort you are experiencing:  Only take over-the-counter or prescription medicines as directed by your health care provider.  Do not take laxatives unless directed to do so by your health care provider.  Try a clear liquid diet (broth, tea, or water) as directed by your health care provider. Slowly move to a bland diet as tolerated. SEEK MEDICAL CARE IF:  You have unexplained abdominal pain.  You have abdominal pain associated with nausea or diarrhea.  You have pain when you urinate or have a bowel movement.  You experience abdominal pain that wakes you in the night.  You have abdominal pain that is worsened or improved by eating food.  You have abdominal pain that is worsened with eating fatty foods.  You have a fever. SEEK IMMEDIATE MEDICAL CARE IF:  Your pain does not go away within 2 hours.  You keep throwing up  (vomiting).  Your pain is felt only in portions of the abdomen, such as the right side or the left lower portion of the abdomen.  You pass bloody or black tarry stools. MAKE SURE YOU:  Understand these instructions.  Will watch your condition.  Will get help right away if you are not doing well or get worse.   This information is not intended to replace advice given to you by your health care provider. Make sure you discuss any questions you have with your health care provider.   Document Released: 08/17/2005 Document Revised: 07/29/2015 Document Reviewed: 07/17/2013 Elsevier Interactive Patient Education [employer]2016 Elsevier Inc.

## 2016-03-30 NOTE — ED Provider Notes (Signed)
[facility] Emergency Department Provider Note  Time seen: 7:05 AM  I have reviewed the triage vital signs and the nursing notes.   HISTORY  Chief Complaint Abdominal Pain    HPI Brandon MeyerJerry Lee Boerema Jr. is a 35 y.o. male with no past medical history who presents to the emergency department with left-sided abdominal pain. According to the patient for the past 3-4 hours she has been experiencing intermittent sharp pains to his left flank. States he was at work when the pain began. Describes the pain as sharp pain 7/10 in severity only lasting a couple seconds and then completely resolving. Denies any dysuria, hematuria, nausea, vomiting, diarrhea, fever. Patient denies a history of similar pains in the past. Currently denies any pain however will occasionally grimace in pain.     No past medical history on file.  There are no active problems to display for this patient.   Past Surgical History  Procedure Laterality Date  . Appendectomy    . Hernia repair      Current Outpatient Rx  Name  Route  Sig  Dispense  Refill  . ketorolac (TORADOL) 10 MG tablet   Oral   Take 1 tablet (10 mg total) by mouth every 8 (eight) hours.   15 tablet   0     Allergies Codeine and Penicillins  No family history on file.  Social History Social History  Substance Use Topics  . Smoking status: Current Every Day Smoker -- 0.50 packs/day    Types: Cigarettes  . Smokeless tobacco: Not on file  . Alcohol Use: No    Review of Systems Constitutional: Negative for fever. Cardiovascular: Negative for chest pain. Respiratory: Negative for shortness of breath. Gastrointestinal: Left-sided abdominal pain. Negative for nausea, vomiting, diarrhea Genitourinary: Negative for dysuria. Negative for hematuria Musculoskeletal: Negative for back pain. Neurological: Negative for headache 10-point ROS otherwise negative.  ____________________________________________   PHYSICAL  EXAM:  VITAL SIGNS: ED Triage Vitals  Enc Vitals Group     BP 03/30/16 0625 130/81 mmHg     Pulse Rate 03/30/16 0625 77     Resp 03/30/16 0625 20     Temp 03/30/16 0625 97.6 F (36.4 C)     Temp Source 03/30/16 0625 Oral     SpO2 03/30/16 0625 98 %     Weight 03/30/16 0625 290 lb (131.543 kg)     Height 03/30/16 0625 6\' 3"  (1.905 m)     Head Cir --      Peak Flow --      Pain Score 03/30/16 0626 4     Pain Loc --      Pain Edu? --      Excl. in GC? --     Constitutional: Alert and oriented. Well appearing and in no distress. Eyes: Normal exam ENT   Head: Normocephalic and atraumatic.   Mouth/Throat: Mucous membranes are moist. Cardiovascular: Normal rate, regular rhythm. No murmur Respiratory: Normal respiratory effort without tachypnea nor retractions. Breath sounds are clear  Gastrointestinal: Soft, mild to moderate left-sided abdominal tenderness to palpation. No rebound or guarding. No distention. No CVA tenderness. Musculoskeletal: Nontender with normal range of motion in all extremities. Neurologic:  Normal speech and language. No gross focal neurologic deficits  Skin:  Skin is warm, dry and intact.  Psychiatric: Mood and affect are normal.  ____________________________________________    INITIAL IMPRESSION / ASSESSMENT AND PLAN / ED COURSE  Pertinent labs & imaging results that were available during my  care of the patient were reviewed by me and considered in my medical decision making (see chart for details).  Patient presents emergency department with intermittent left flank pain for the past 3-4 hours. Currently the patient appears very well, no distress, denies any pain at the present time. Patient does however have mild to moderate tenderness palpation of the left abdomen. No CVA tenderness. No history of kidney stones. No diarrhea or fever. We will check labs and continue to closely monitor in the emergency department.   Labs are within normal limits.  Patient states the pain is largely resolved at this time will occasionally feel a sharp pain. I discussed for the patient proceeded with a CT scan versus a trial of Ultram and follow-up in 24 hours if the pain remains. The patient much prefers to go home and follow-up in 24 hours if the pain remains or worsens. Given his well appearance and normal labs and vitals I believe this is a safe option.  ____________________________________________   FINAL CLINICAL IMPRESSION(S) / ED DIAGNOSES  Left-sided abdominal pain   Minna Antis, MD 03/30/16 [phone]

## 2017-07-30 ENCOUNTER — Ambulatory Visit
Admission: EM | Admit: 2017-07-30 | Discharge: 2017-07-30 | Disposition: A | Discharge status: Home / Self Care | Payer: 59 | Attending: Family Medicine | Admitting: Family Medicine

## 2017-07-30 DIAGNOSIS — L03221 Cellulitis of neck: Secondary | ICD-10-CM

## 2017-07-30 DIAGNOSIS — L0211 Cutaneous abscess of neck: Secondary | ICD-10-CM

## 2017-07-30 MED ORDER — MUPIROCIN 2 % EX OINT: 1.0000 "application " | TOPICAL_OINTMENT | Freq: Two times a day (BID) | CUTANEOUS | 0 refills | Status: DC

## 2017-07-30 MED ORDER — SULFAMETHOXAZOLE-TRIMETHOPRIM 800-160 MG PO TABS: 1.0000 | ORAL_TABLET | Freq: Two times a day (BID) | ORAL | 0 refills | Status: DC

## 2017-07-30 MED ORDER — CEFTRIAXONE SODIUM 1 G IJ SOLR
1.0000 g | Freq: Once | INTRAMUSCULAR | Status: AC
  Administered 2017-07-30: 1 g via INTRAMUSCULAR

## 2017-07-30 NOTE — ED Provider Notes (Signed)
[facility]    CSN: [id number] Arrival date & time: 07/30/17  1349     History   Chief Complaint Chief Complaint  Patient presents with  . Abscess    HPI Brandon Hebert. is a 36 y.o. male.   Patient is a 36 year old white male with a history of having swelling over his right neck and chin for about 3 weeks. He states he just ignored the symptoms but in the last 4-5 days the redness has gotten worse increased swelling and increased pain and finally his brother told him that need to be seen and have that evaluated. He finally came in today. He is allergic to penicillin and codeine. No history of medical disease no previous surgeries or operations other than appendectomy. Unfortunately warned he needs to stop. No pertinent family medical history relevant to today's visit.   The history is provided by the patient. No language interpreter was used.  Abscess  Location:  Head/neck Head/neck abscess location:  R neck Abscess quality: induration, itching, painful, redness and warmth   Abscess quality: no fluctuance and not weeping   Red streaking: yes   Duration:  3 weeks Progression:  Worsening Pain details:    Quality:  Throbbing and aching   Progression:  Worsening Chronicity:  New Context: skin injury   Context: not diabetes, not immunosuppression, not injected drug use and not insect bite/sting     History reviewed. No pertinent past medical history.  There are no active problems to display for this patient.   Past Surgical History:  Procedure Laterality Date  . APPENDECTOMY         Home Medications    Prior to Admission medications   Medication Sig Start Date End Date Taking? Authorizing Provider  ketorolac (TORADOL) 10 MG tablet Take 1 tablet (10 mg total) by mouth every 8 (eight) hours. 05/07/15   Menshew, Charlesetta Ivory, PA-C  mupirocin ointment (BACTROBAN) 2 % Apply 1 application topically 2 (two) times daily. 07/30/17   Hassan Rowan, MD    sulfamethoxazole-trimethoprim (BACTRIM DS,SEPTRA DS) 800-160 MG tablet Take 1 tablet by mouth 2 (two) times daily. 07/30/17   Hassan Rowan, MD    Family History History reviewed. No pertinent family history.  Social History Social History  Substance Use Topics  . Smoking status: Current Every Day Smoker    Packs/day: 0.50    Types: Cigarettes  . Smokeless tobacco: Current User    Types: Snuff  . Alcohol use Yes     Comment: social     Allergies   Codeine and Penicillins   Review of Systems Review of Systems  Skin: Positive for color change and rash.  All other systems reviewed and are negative.    Physical Exam Triage Vital Signs ED Triage Vitals  Enc Vitals Group     BP 07/30/17 1357 126/84     Pulse Rate 07/30/17 1357 81     Resp 07/30/17 1357 (S) 18     Temp 07/30/17 1357 98.8 F (37.1 C)     Temp Source 07/30/17 1357 Oral     SpO2 07/30/17 1357 99 %     Weight 07/30/17 1358 295 lb (133.8 kg)     Height 07/30/17 1358  (1.93 m)     Head Circumference --      Peak Flow --      Pain Score 07/30/17 1358 7     Pain Loc --      Pain Edu? --  Excl. in GC? --    No data found.   Updated Vital Signs BP 126/84 (BP Location: Left Arm)   Pulse 81   Temp 98.8 F (37.1 C) (Oral)   Resp (S) 18   Ht 6\' 4"  (1.93 m)   Wt 295 lb (133.8 kg)   SpO2 99%   BMI 35.91 kg/m   Visual Acuity Right Eye Distance:   Left Eye Distance:   Bilateral Distance:    Right Eye Near:   Left Eye Near:    Bilateral Near:     Physical Exam  Constitutional: He is oriented to person, place, and time. He appears well-developed and well-nourished.  HENT:  Head: Normocephalic and atraumatic.  Right Ear: External ear normal.  Left Ear: External ear normal.  Mouth/Throat: Oropharynx is clear and moist.  Eyes: Pupils are equal, round, and reactive to light.  Neck: Neck supple.    Abscess-like lesion on the right neck near the mandible areas tender to palpation areas warm  red but not fluctuant enough that would recommend open I&D. More of a cellulitis and early abscess.  Pulmonary/Chest: Effort normal.  Musculoskeletal: He exhibits tenderness. He exhibits no edema or deformity.  Lymphadenopathy:    He has cervical adenopathy.  Neurological: He is alert and oriented to person, place, and time.  Skin: Skin is warm. Lesion and rash noted. There is erythema.  Psychiatric: He has a normal mood and affect.  Vitals reviewed.    UC Treatments / Results  Labs (all labs ordered are listed, but only abnormal results are displayed) Labs Reviewed - No data to display  EKG  EKG Interpretation None       Radiology No results found.  Procedures Procedures (including critical care time)  Medications Ordered in UC Medications  cefTRIAXone (ROCEPHIN) injection 1 g (1 g Intramuscular Given 07/30/17 1530)     Initial Impression / Assessment and Plan / UC Course  I have reviewed the triage vital signs and the nursing notes.  Pertinent labs & imaging results that were available during my care of the patient were reviewed by me and considered in my medical decision making (see chart for details).     Films performed if we can keep this from having to be open to try best. He's had this for 2 weeks we'll give him a shot of Rocephin 1 g Septra DS 1 tablet twice a day) ointment applied twice a day. Follow. 2-3 days or with PCP as needed.  Final Clinical Impressions(s) / UC Diagnoses   Final diagnoses:  Cellulitis and abscess of neck    New Prescriptions Discharge Medication List as of 07/30/2017  3:39 PM    START taking these medications   Details  mupirocin ointment (BACTROBAN) 2 % Apply 1 application topically 2 (two) times daily., Starting Sun 07/30/2017, Normal    sulfamethoxazole-trimethoprim (BACTRIM DS,SEPTRA DS) 800-160 MG tablet Take 1 tablet by mouth 2 (two) times daily., Starting Sun 07/30/2017, Normal       Note: This dictation was prepared with  Dragon dictation along with smaller phrase technology. Any transcriptional errors that result from this process are unintentional.  Controlled Substance Prescriptions [place] Controlled Substance Registry consulted? Not Applicable   Hassan RowanWade, Anabia Weatherwax, MD 07/30/17 [phone]

## 2017-07-30 NOTE — ED Triage Notes (Signed)
Pt with right neck soreness and redness. "Started out as a knot and has gotten bigger." Pain 7/10. No draining anything as yet

## 2017-08-06 ENCOUNTER — Encounter: Payer: Self-pay | Admitting: Emergency Medicine

## 2017-08-06 ENCOUNTER — Ambulatory Visit
Admission: EM | Admit: 2017-08-06 | Discharge: 2017-08-06 | Disposition: A | Discharge status: Home / Self Care | Payer: 59 | Attending: Family Medicine | Admitting: Family Medicine

## 2017-08-06 DIAGNOSIS — L0291 Cutaneous abscess, unspecified: Secondary | ICD-10-CM

## 2017-08-06 DIAGNOSIS — Z23 Encounter for immunization: Secondary | ICD-10-CM | POA: Diagnosis not present

## 2017-08-06 DIAGNOSIS — L0211 Cutaneous abscess of neck: Secondary | ICD-10-CM

## 2017-08-06 MED ORDER — SULFAMETHOXAZOLE-TRIMETHOPRIM 800-160 MG PO TABS: 1.0000 | ORAL_TABLET | Freq: Two times a day (BID) | ORAL | 0 refills | Status: DC

## 2017-08-06 MED ORDER — TETANUS-DIPHTH-ACELL PERTUSSIS 5-2.5-18.5 LF-MCG/0.5 IM SUSP
0.5000 mL | Freq: Once | INTRAMUSCULAR | Status: AC
Start: 2017-08-06 — End: 2017-08-06
  Administered 2017-08-06: 0.5 mL via INTRAMUSCULAR

## 2017-08-06 MED ORDER — LIDOCAINE HCL (PF) 1 % IJ SOLN: 5.0000 mL | Freq: Once | INTRAMUSCULAR | Status: DC

## 2017-08-06 NOTE — ED Provider Notes (Addendum)
[facility] ____________________________________________  Time seen: Approximately 1:46 PM  I have reviewed the triage vital signs and the nursing notes.   HISTORY  Chief Complaint Abscess   HPI Brandon Hebert. is a 36 y.o. male  presenting for evaluation of abscess to right lower neck that is in present for approximately 2-3 weeks. Patient states approximately 2 weeks ago he was seen in urgent care for the same complaint. States that he took the oral antibiotic as prescribed and some of the redness around the area improved but the abscess portion continued to grow in size. Denies drainage. States the area is mild to moderately tender, worse with direct palpation. Denies decreased range of motion, fevers, other rash, sore throat or difficulty swallowing. Reports continues to eat and drink well. States he has had a little knot to that area for years, stating may be greater than 5 years. Denies history of this in the past. Denies history of MRSA. No other Medications taken prior to arrival. Denies other alleviating factors. Denies recent sickness. Denies recent antibiotic use. Unsure of last tetanus immunization.   History reviewed. No pertinent past medical history. Denies  There are no active problems to display for this patient.   Past Surgical History:  Procedure Laterality Date  . APPENDECTOMY        Current Facility-Administered Medications:  .  lidocaine (PF) (XYLOCAINE) 1 % injection 5 mL, 5 mL, Other, Once, Renford Dills, NP  Current Outpatient Prescriptions:  .  mupirocin ointment (BACTROBAN) 2 %, Apply 1 application topically 2 (two) times daily., Disp: 22 g, Rfl: 0 .  sulfamethoxazole-trimethoprim (BACTRIM DS,SEPTRA DS) 800-160 MG tablet, Take 1 tablet by mouth 2 (two) times daily., Disp: 20 tablet, Rfl: 0 .  sulfamethoxazole-trimethoprim (BACTRIM DS,SEPTRA DS) 800-160 MG tablet, Take 1 tablet by mouth 2 (two) times daily., Disp: 20 tablet, Rfl:  0  Allergies Codeine and Penicillins   pertinent family history No others in household with similar history.  Social History Social History  Substance Use Topics  . Smoking status: Current Every Day Smoker    Packs/day: 0.50    Types: Cigarettes  . Smokeless tobacco: Current User    Types: Snuff  . Alcohol use Yes     Comment: social    Review of Systems Constitutional: No fever/chills ENT: No sore throat. Cardiovascular: Denies chest pain. Respiratory: Denies shortness of breath. Gastrointestinal: No abdominal pain.   Musculoskeletal: Negative for back pain. Skin: AS above.   ____________________________________________   PHYSICAL EXAM:  VITAL SIGNS: ED Triage Vitals  Enc Vitals Group     BP 08/06/17 1300 (!) 143/90     Pulse Rate 08/06/17 1300 70     Resp 08/06/17 1300 16     Temp 08/06/17 1300 97.8 F (36.6 C)     Temp Source 08/06/17 1300 Oral     SpO2 08/06/17 1300 98 %     Weight 08/06/17 1259 290 lb (131.5 kg)     Height 08/06/17 1259  (1.905 m)     Head Circumference --      Peak Flow --      Pain Score 08/06/17 1259 4     Pain Loc --      Pain Edu? --      Excl. in GC? --     Constitutional: Alert and oriented. Well appearing and in no acute distress. Eyes: Conjunctivae are normal.  ENT      Head: Normocephalic and atraumatic.  Nose: No congestion/rhinnorhea.      Mouth/Throat: Mucous membranes are moist.Oropharynx non-erythematous. Neck: No stridor. Supple without meningismus.  Hematological/Lymphatic/Immunilogical: Mild righ cervical lymphadenopathy. Cardiovascular: Normal rate, regular rhythm. Grossly normal heart sounds.  Good peripheral circulation. Respiratory: Normal respiratory effort without tachypnea nor retractions. Breath sounds are clear and equal bilaterally. No wheezes, rales, rhonchi. Musculoskeletal: No midline cervical, thoracic or lumbar tenderness to palpation.  Neurologic:  Normal speech and language. Speech is  normal. No gait instability. No meningismus.  Skin:  Skin is warm, dry Except: right lateral lower neck approximately 3x3.5cm tender, fluctuant, pointing abscess with minimal surrounding erythema, no surrounding tenderness to palpation, no midline tenderness, full range of motion present to neck.  Psychiatric: Mood and affect are normal. Speech and behavior are normal. Patient exhibits appropriate insight and judgment   ___________________________________________   LABS (all labs ordered are listed, but only abnormal results are displayed)  Labs Reviewed  AEROBIC CULTURE (SUPERFICIAL SPECIMEN)    PROCEDURES Procedures   Procedure(s) performed:  Procedure(s) performed:  Procedure explained and verbal consent obtained. Consent: Verbal consent obtained. Written consent not obtained. Risks and benefits: risks, benefits and alternatives were discussed Patient identity confirmed: verbally with patient and hospital-assigned identification number  Consent given by: patient   I&D abscess Location: right neck Preparation: Patient was prepped and draped in the usual sterile fashion. Anesthesia with 1% Lidocaine 3 mls Irrigation solution: saline and betadine Amount of cleaning: copious Incision made with #11 blade scalpel Moderate purulent drainage immediately obtained with expression. Sterile forceps used to probe and break up loculations.  1/2 " iodoform gauze used and packed.  Patient tolerated well.  dressing applied.  Wound care instructions provided.  Observe for any signs of infection or other problems.    INITIAL IMPRESSION / ASSESSMENT AND PLAN / ED COURSE  Pertinent labs & imaging results that were available during my care of the patient were reviewed by me and considered in my medical decision making (see chart for details).  Well-appearing patient. Patient with right lateral neck abscess, appears superficial and fluctuant. I&D performed. Patient tolerated well. Packing  placed. Patient reports that he has had an area to the same area for years, suspect patient with infected sebaceous cyst. Discussed patient for patient follow-up with outpatient surgery for follow-up, information given. Return to urgent care as needed. Will start patient back on oral Bactrim. Encourage warm compresses, wound care and supportive care. Work note given for today and tomorrow. Discussed indication, risks and benefits of medications with patient. Tetanus immunization updated.   Discussed follow up with Primary care physician this week. Discussed follow up and return parameters including no resolution or any worsening concerns. Patient verbalized understanding and agreed to plan.   ____________________________________________   FINAL CLINICAL IMPRESSION(S) / ED DIAGNOSES  Final diagnoses:  Abscess     Discharge Medication List as of 08/06/2017  2:25 PM    START taking these medications   Details  !! sulfamethoxazole-trimethoprim (BACTRIM DS,SEPTRA DS) 800-160 MG tablet Take 1 tablet by mouth 2 (two) times daily., Starting Sun 08/06/2017, Until Sun 08/13/2017, Normal     !! - Potential duplicate medications found. Please discuss with provider.      Note: This dictation was prepared with Dragon dictation along with smaller phrase technology. Any transcriptional errors that result from this process are unintentional.         Renford Dills, NP 08/06/17 1536

## 2017-08-06 NOTE — ED Triage Notes (Signed)
Patient c/o abscess on his neck for over 2 weeks.  Patient was on a oral antibiotic. Patient denies fevers.

## 2017-08-06 NOTE — Discharge Instructions (Signed)
Take medication as prescribed. Keep clean and dry. Keep packing in place.   Follow up with surgery this week as discussed. Follow up in 3-4 days.  Follow up with your primary care physician this week as needed. Return to Urgent care sooner for new or worsening concerns.

## 2017-08-09 ENCOUNTER — Encounter: Payer: Self-pay | Admitting: Surgery

## 2017-08-09 ENCOUNTER — Ambulatory Visit (INDEPENDENT_AMBULATORY_CARE_PROVIDER_SITE_OTHER): Payer: 59 | Admitting: Surgery

## 2017-08-09 VITALS — BP 132/84 | HR 84 | Temp 98.3°F | Ht 75.0 in | Wt 298.0 lb

## 2017-08-09 DIAGNOSIS — L0211 Cutaneous abscess of neck: Secondary | ICD-10-CM | POA: Diagnosis not present

## 2017-08-09 LAB — AEROBIC CULTURE W GRAM STAIN (SUPERFICIAL SPECIMEN): Culture: NO GROWTH

## 2017-08-09 LAB — AEROBIC CULTURE  (SUPERFICIAL SPECIMEN)

## 2017-08-09 NOTE — Patient Instructions (Signed)
We will see you back in 2 weeks to make sure that you are doing better. Please finish all of your antibiotics.   Incision and Drainage, Care After Refer to this sheet in the next few weeks. These instructions provide you with information about caring for yourself after your procedure. Your health care provider may also give you more specific instructions. Your treatment has been planned according to current medical practices, but problems sometimes occur. Call your health care provider if you have any problems or questions after your procedure. What can I expect after the procedure? After the procedure, it is common to have:  Pain or discomfort around your incision site.  Drainage from your incision.  Follow these instructions at home:  Take over-the-counter and prescription medicines only as told by your health care provider.  If you were prescribed an antibiotic medicine, take it as told by your health care provider.Do not stop taking the antibiotic even if you start to feel better.  Followinstructions from your health care provider about: ? How to take care of your incision. ? When and how you should change your packing and bandage (dressing). Wash your hands with soap and water before you change your dressing. If soap and water are not available, use hand sanitizer. ? When you should remove your dressing.  Do not take baths, swim, or use a hot tub until your health care provider approves.  Keep all follow-up visits as told by your health care provider. This is important.  Check your incision area every day for signs of infection. Check for: ? More redness, swelling, or pain. ? More fluid or blood. ? Warmth. ? Pus or a bad smell. Contact a health care provider if:  Your cyst or abscess returns.  You have a fever.  You have more redness, swelling, or pain around your incision.  You have more fluid or blood coming from your incision.  Your incision feels warm to the  touch.  You have pus or a bad smell coming from your incision. Get help right away if:  You have severe pain or bleeding.  You cannot eat or drink without vomiting.  You have decreased urine output.  You become short of breath.  You have chest pain.  You cough up blood.  The area where the incision and drainage occurred becomes numb or it tingles. This information is not intended to replace advice given to you by your health care provider. Make sure you discuss any questions you have with your health care provider. Document Released: 01/30/2012 Document Revised: 04/08/2016 Document Reviewed: 08/28/2015 Elsevier Interactive Patient Education  2017 [employer].

## 2017-08-09 NOTE — Progress Notes (Signed)
Surgical Consultation  08/09/2017  Brandon Hebert. is an 36 y.o. male.   Referring Physician: urgent care  ZO:[id number] abscess  HPI: This patient status post I&D of an abscess of the neck in urgent care on Sunday. Packing was placed and a packing is still present several days later. He is doing well and is taking Bactrim. See the patient for ongoing wound care.  No past medical history on file.  Past Surgical History:  Procedure Laterality Date  . APPENDECTOMY      No family history on file.  Social History:  reports that he has been smoking Cigarettes.  He has been smoking about 0.50 packs per day. His smokeless tobacco use includes Snuff. He reports that he drinks alcohol. He reports that he does not use drugs.  Allergies:  Allergies  Allergen Reactions  . Codeine   . Penicillins     Medications reviewed.   Review of Systems:   Review of Systems  Constitutional: Negative for chills and fever.  HENT: Negative.   Eyes: Negative.   Respiratory: Negative.   Cardiovascular: Negative.   Gastrointestinal: Negative.   Genitourinary: Negative.   Musculoskeletal: Negative.   Skin: Negative.   Neurological: Negative.   Endo/Heme/Allergies: Negative.   Psychiatric/Behavioral: Negative.      Physical Exam:  BP 132/84   Pulse 84   Temp 98.3 F (36.8 C) (Oral)   Ht  (1.905 m)   Wt 298 lb (135.2 kg)   BMI 37.25 kg/m   Physical Exam  Constitutional: He is oriented to person, place, and time and well-developed, well-nourished, and in no distress. No distress.  HENT:  Head: Normocephalic and atraumatic.  Eyes: Pupils are equal, round, and reactive to light. Right eye exhibits no discharge. Left eye exhibits no discharge. No scleral icterus.  Neck:  5 mm incision draining minimal purulence minimal erythema present minimally tender  Somewhat restricted range of motion in turning to the right  Cardiovascular: Normal rate and regular rhythm.   Pulmonary/Chest:  Effort normal. No respiratory distress. He has no wheezes. He has no rales.  Musculoskeletal: Normal range of motion. He exhibits no edema.  Neurological: He is alert and oriented to person, place, and time.  Skin: Skin is warm. No rash noted. He is not diaphoretic. There is erythema.  Vitals reviewed.     No results found for this or any previous visit (from the past 48 hour(s)). No results found.  Assessment/Plan:  Wound inspected and still draining minimal purulence but seems to be on its way to healing and the right neck. I would recommend dry dressing only and finishing out his antibiotics. Should it worsen he should return to the clinic otherwise we could see him back in about 10 days for follow-up.  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 I suggested that he refrain from work until he is able to turn his neck but could return to work without restriction once he is able to do that.  Lattie Haw, MD, FACS

## 2017-08-30 ENCOUNTER — Encounter: Payer: Self-pay | Admitting: Surgery

## 2017-08-30 ENCOUNTER — Ambulatory Visit (INDEPENDENT_AMBULATORY_CARE_PROVIDER_SITE_OTHER): Payer: 59 | Admitting: Surgery

## 2017-08-30 VITALS — BP 134/93 | HR 76 | Temp 98.3°F | Ht 75.0 in | Wt 299.0 lb

## 2017-08-30 DIAGNOSIS — L0211 Cutaneous abscess of neck: Secondary | ICD-10-CM | POA: Diagnosis not present

## 2017-08-30 NOTE — Progress Notes (Signed)
Outpatient Surgical Follow Up  08/30/2017  Brandon Hebert. is an 36 y.o. male.   CC: Neck abscess  HPI: Patient seen previously with a neck abscess that was drained in the emergency room. He has finished his antibiotics. He describes itchiness around a small mass in his right neck. Fevers or chills  Past Medical History:  Diagnosis Date  . Abscess of neck     Past Surgical History:  Procedure Laterality Date  . APPENDECTOMY      Family History  Problem Relation Age of Onset  . Healthy Mother   . Cancer Neg Hx     Social History:  reports that he has been smoking Cigarettes.  He has been smoking about 0.50 packs per day. His smokeless tobacco use includes Snuff. He reports that he drinks alcohol. He reports that he does not use drugs.  Allergies:  Allergies  Allergen Reactions  . Codeine   . Penicillins     Medications reviewed.   Review of Systems:   Review of Systems  Constitutional: Negative.   HENT: Negative.   Eyes: Negative.   Respiratory: Negative.   Cardiovascular: Negative.   Gastrointestinal: Negative.   Genitourinary: Negative.   Musculoskeletal: Negative.   Skin: Negative.   Neurological: Negative.   Endo/Heme/Allergies: Negative.   Psychiatric/Behavioral: Negative.      Physical Exam:  BP (!) 134/93   Pulse 76   Temp 98.3 F (36.8 C) (Oral)   Ht  (1.905 m)   Wt 299 lb (135.6 kg)   BMI 37.37 kg/m   Physical Exam  Constitutional: He is well-developed, well-nourished, and in no distress. No distress.  HENT:  Head: Normocephalic and atraumatic.  Tiny punctate hole around minimal induration but no erythema. Able to express tiny amount of purulence. Nontender  Skin: He is not diaphoretic.  Vitals reviewed.     No results found for this or any previous visit (from the past 48 hour(s)). No results found.  Assessment/Plan:  Resolving abscess which is almost completely healed. Recommend follow up on an as-needed  basis  Lattie Haw, MD, FACS

## 2017-08-30 NOTE — Patient Instructions (Signed)
Please give us a call in case you have any questions or concerns.  

## 2017-11-28 ENCOUNTER — Other Ambulatory Visit: Payer: Self-pay

## 2017-11-28 ENCOUNTER — Emergency Department: Payer: Commercial Managed Care - HMO

## 2017-11-28 ENCOUNTER — Emergency Department
Admission: EM | Admit: 2017-11-28 | Discharge: 2017-11-28 | Disposition: A | Discharge status: Home / Self Care | Payer: Commercial Managed Care - HMO | Attending: Emergency Medicine | Admitting: Emergency Medicine

## 2017-11-28 DIAGNOSIS — Y92410 Unspecified street and highway as the place of occurrence of the external cause: Secondary | ICD-10-CM | POA: Insufficient documentation

## 2017-11-28 DIAGNOSIS — S24109A Unspecified injury at unspecified level of thoracic spinal cord, initial encounter: Secondary | ICD-10-CM

## 2017-11-28 DIAGNOSIS — F17210 Nicotine dependence, cigarettes, uncomplicated: Secondary | ICD-10-CM | POA: Insufficient documentation

## 2017-11-28 DIAGNOSIS — S299XXA Unspecified injury of thorax, initial encounter: Secondary | ICD-10-CM | POA: Diagnosis present

## 2017-11-28 DIAGNOSIS — Y9389 Activity, other specified: Secondary | ICD-10-CM | POA: Diagnosis not present

## 2017-11-28 DIAGNOSIS — Y998 Other external cause status: Secondary | ICD-10-CM | POA: Diagnosis not present

## 2017-11-28 MED ORDER — IBUPROFEN 600 MG PO TABS: 600.0000 mg | ORAL_TABLET | Freq: Four times a day (QID) | ORAL | 0 refills | Status: DC | PRN

## 2017-11-28 NOTE — ED Notes (Signed)
c-collar placed on patient.
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
## 2018-08-22 ENCOUNTER — Other Ambulatory Visit: Payer: Self-pay

## 2018-08-22 ENCOUNTER — Ambulatory Visit
Admission: EM | Admit: 2018-08-22 | Discharge: 2018-08-22 | Disposition: A | Discharge status: Home / Self Care | Payer: 59 | Attending: Family Medicine | Admitting: Family Medicine

## 2018-08-22 DIAGNOSIS — R197 Diarrhea, unspecified: Secondary | ICD-10-CM

## 2018-08-22 LAB — CBC WITH DIFFERENTIAL/PLATELET
BASOS ABS: 0.1 10*3/uL (ref 0–0.1)
BASOS PCT: 1 %
Eosinophils Absolute: 0.3 10*3/uL (ref 0–0.7)
Eosinophils Relative: 2 %
HEMATOCRIT: 51.6 % (ref 40.0–52.0)
HEMOGLOBIN: 17.6 g/dL (ref 13.0–18.0)
LYMPHS PCT: 30 %
Lymphs Abs: 3.4 10*3/uL (ref 1.0–3.6)
MCH: 28.7 pg (ref 26.0–34.0)
MCHC: 34 g/dL (ref 32.0–36.0)
MCV: 84.3 fL (ref 80.0–100.0)
MONO ABS: 0.7 10*3/uL (ref 0.2–1.0)
MONOS PCT: 7 %
NEUTROS ABS: 7 10*3/uL — AB (ref 1.4–6.5)
Neutrophils Relative %: 60 %
Platelets: 317 10*3/uL (ref 150–440)
RBC: 6.12 MIL/uL — AB (ref 4.40–5.90)
RDW: 14 % (ref 11.5–14.5)
WBC: 11.5 10*3/uL — ABNORMAL HIGH (ref 3.8–10.6)

## 2018-08-22 LAB — COMPREHENSIVE METABOLIC PANEL
ALBUMIN: 4.6 g/dL (ref 3.5–5.0)
ALK PHOS: 82 U/L (ref 38–126)
ALT: 26 U/L (ref 0–44)
AST: 24 U/L (ref 15–41)
Anion gap: 7 (ref 5–15)
BILIRUBIN TOTAL: 0.5 mg/dL (ref 0.3–1.2)
BUN: 15 mg/dL (ref 6–20)
CO2: 24 mmol/L (ref 22–32)
Calcium: 9.1 mg/dL (ref 8.9–10.3)
Chloride: 107 mmol/L (ref 98–111)
Creatinine, Ser: 0.92 mg/dL (ref 0.61–1.24)
GFR calc Af Amer: >60 mL/min (ref >60)
GLUCOSE: 110 mg/dL — AB (ref 70–99)
Potassium: 4 mmol/L (ref 3.5–5.1)
Sodium: 138 mmol/L (ref 135–145)
TOTAL PROTEIN: 8 g/dL (ref 6.5–8.1)

## 2018-08-22 NOTE — ED Triage Notes (Signed)
Patient complains of diarrhea that started on Friday. Patient states that he is going around 5 times a day and reports that this occurs anytime he eats.

## 2018-08-22 NOTE — ED Provider Notes (Signed)
[facility]    CSN: [id number] Arrival date & time: 08/22/18  6213     History   Chief Complaint Chief Complaint  Patient presents with  . Diarrhea    HPI Brandon Hebert. is a 37 y.o. male.   The history is provided by the patient.  Diarrhea  Quality:  Semi-solid and watery (initially watery but now semi-solid) Severity:  Moderate Onset quality:  Sudden Duration:  5 days Timing:  Intermittent Progression:  Improving Relieved by:  Nothing Ineffective treatments: pepto bismol. Associated symptoms: vomiting (once 4 days ago)   Associated symptoms: no abdominal pain, no arthralgias, no chills, no recent cough, no diaphoresis, no fever, no headaches, no myalgias and no URI   Risk factors: sick contacts   Risk factors: no recent antibiotic use and no travel to endemic areas  Suspicious food intake: unknown.     Past Medical History:  Diagnosis Date  . Abscess of neck     There are no active problems to display for this patient.   Past Surgical History:  Procedure Laterality Date  . APPENDECTOMY         Home Medications    Prior to Admission medications   Medication Sig Start Date End Date Taking? Authorizing Provider  ibuprofen (ADVIL,MOTRIN) 600 MG tablet Take 1 tablet (600 mg total) by mouth every 6 (six) hours as needed. 11/28/17   Enid Derry, PA-C    Family History Family History  Problem Relation Age of Onset  . Healthy Mother   . Cancer Neg Hx     Social History Social History   Tobacco Use  . Smoking status: Current Every Day Smoker    Packs/day: 0.50    Types: Cigarettes  . Smokeless tobacco: Current User    Types: Snuff  Substance Use Topics  . Alcohol use: Yes    Comment: social  . Drug use: No     Allergies   Codeine and Penicillins   Review of Systems Review of Systems  Constitutional: Negative for chills, diaphoresis and fever.  Gastrointestinal: Positive for diarrhea and vomiting (once 4 days ago).  Negative for abdominal pain.  Musculoskeletal: Negative for arthralgias and myalgias.  Neurological: Negative for headaches.     Physical Exam Triage Vital Signs ED Triage Vitals  Enc Vitals Group     BP 08/22/18 0917 (!) 131/93     Pulse Rate 08/22/18 0917 85     Resp 08/22/18 0917 16     Temp 08/22/18 0917 98.6 F (37 C)     Temp Source 08/22/18 0917 Oral     SpO2 08/22/18 0917 96 %     Weight 08/22/18 0916 (!) 315 lb (142.9 kg)     Height 08/22/18 0916 6\' 3"  (1.905 m)     Head Circumference --      Peak Flow --      Pain Score 08/22/18 0916 0     Pain Loc --      Pain Edu? --      Excl. in GC? --    No data found.  Updated Vital Signs BP (!) 131/93 (BP Location: Left Arm)   Pulse 85   Temp 98.6 F (37 C) (Oral)   Resp 16   Ht 6\' 3"  (1.905 m)   Wt (!) 142.9 kg   SpO2 96%   BMI 39.37 kg/m   Visual Acuity Right Eye Distance:   Left Eye Distance:   Bilateral Distance:    Right  Eye Near:   Left Eye Near:    Bilateral Near:     Physical Exam  Constitutional: He is oriented to person, place, and time. He appears well-developed and well-nourished. No distress.  Cardiovascular: Normal rate.  No murmur heard. Pulmonary/Chest: Effort normal. No respiratory distress.  Abdominal: Soft. Bowel sounds are normal. He exhibits no distension and no mass. There is no tenderness. There is no rebound and no guarding.  Neurological: He is alert and oriented to person, place, and time.  Skin: No rash noted. He is not diaphoretic.  Nursing note and vitals reviewed.    UC Treatments / Results  Labs (all labs ordered are listed, but only abnormal results are displayed) Labs Reviewed  COMPREHENSIVE METABOLIC PANEL - Abnormal; Notable for the following components:      Result Value   Glucose, Bld 110 (*)    All other components within normal limits  CBC WITH DIFFERENTIAL/PLATELET    EKG None  Radiology No results found.  Procedures Procedures (including critical  care time)  Medications Ordered in UC Medications - No data to display  Initial Impression / Assessment and Plan / UC Course  I have reviewed the triage vital signs and the nursing notes.  Pertinent labs & imaging results that were available during my care of the patient were reviewed by me and considered in my medical decision making (see chart for details).      Final Clinical Impressions(s) / UC Diagnoses   Final diagnoses:  Diarrhea, unspecified type    ED Prescriptions    None      1. Lab results and diagnosis reviewed with patient 2. Recommend supportive treatment with otc Imodium, clear liquid diet then advance slowly as tolerated 3. Follow-up prn if symptoms worsen or don't improve   Controlled Substance Prescriptions [place] Controlled Substance Registry consulted? Not Applicable   Payton Mccallum, MD 08/22/18 1003

## 2018-12-21 ENCOUNTER — Encounter: Payer: Self-pay | Admitting: Emergency Medicine

## 2018-12-21 ENCOUNTER — Other Ambulatory Visit: Payer: Self-pay

## 2018-12-21 ENCOUNTER — Ambulatory Visit
Admission: EM | Admit: 2018-12-21 | Discharge: 2018-12-21 | Disposition: A | Discharge status: Home / Self Care | Payer: Worker's Compensation | Attending: Family Medicine | Admitting: Family Medicine

## 2018-12-21 DIAGNOSIS — M546 Pain in thoracic spine: Secondary | ICD-10-CM

## 2018-12-21 DIAGNOSIS — X500XXA Overexertion from strenuous movement or load, initial encounter: Secondary | ICD-10-CM

## 2018-12-21 MED ORDER — TIZANIDINE HCL 4 MG PO TABS: 4.0000 mg | ORAL_TABLET | Freq: Three times a day (TID) | ORAL | 0 refills | Status: DC | PRN

## 2018-12-21 MED ORDER — MELOXICAM 15 MG PO TABS: 15.0000 mg | ORAL_TABLET | Freq: Every day | ORAL | 0 refills | Status: DC | PRN

## 2018-12-21 NOTE — ED Provider Notes (Signed)
[facility]    CSN: [id number] Arrival date & time: 12/21/18  0827  History   Chief Complaint Chief Complaint  Patient presents with  . Back Pain    W/C DOI 12/12/18    HPI  38 year old male presents with upper back pain.  Patient states that he believes that he injured himself on 1/22.  He states that he was lifting heavy fabric rolls at work.  He believes that he twisted or pulled something.  Pain has been mild to moderate.  Has been persistent.  Worse with certain activities particularly sitting and bending down and lifting.  He has taken Tylenol, Aleve, ibuprofen, aspirin with some improvement but no resolution.  He states that Aleve seems to help the best.  No radicular symptoms.  No other associated symptoms.  No other complaints or concerns at this time.  PMH, Surgical Hx, Family Hx, Social History reviewed and updated as below.  Past Medical History:  Diagnosis Date  . Abscess of neck   Obesity  Past Surgical History:  Procedure Laterality Date  . APPENDECTOMY     Home Medications    Prior to Admission medications   Medication Sig Start Date End Date Taking? Authorizing Provider  meloxicam (MOBIC) 15 MG tablet Take 1 tablet (15 mg total) by mouth daily as needed for pain. 12/21/18   Tommie Sams, DO  tiZANidine (ZANAFLEX) 4 MG tablet Take 1 tablet (4 mg total) by mouth every 8 (eight) hours as needed for muscle spasms (Back pain). 12/21/18   Tommie Sams, DO   Family History Family History  Problem Relation Age of Onset  . Hypertension Mother   . Heart attack Father 81  . Cancer Neg Hx     Social History Social History   Tobacco Use  . Smoking status: Current Every Day Smoker    Packs/day: 0.50    Years: 20.00    Pack years: 10.00    Types: Cigarettes  . Smokeless tobacco: Current User    Types: Snuff  Substance Use Topics  . Alcohol use: Yes    Comment: social  . Drug use: No     Allergies   Codeine and Penicillins   Review of  Systems Review of Systems  Constitutional: Negative.   Musculoskeletal: Positive for back pain.   Physical Exam Triage Vital Signs ED Triage Vitals  Enc Vitals Group     BP 12/21/18 0845 (!) 140/92     Pulse Rate 12/21/18 0845 99     Resp 12/21/18 0845 18     Temp 12/21/18 0845 98.6 F (37 C)     Temp Source 12/21/18 0845 Oral     SpO2 12/21/18 0845 98 %     Weight 12/21/18 0844 (!) 315 lb (142.9 kg)     Height 12/21/18 0844 6\' 3"  (1.905 m)     Head Circumference --      Peak Flow --      Pain Score 12/21/18 0844 3     Pain Loc --      Pain Edu? --      Excl. in GC? --    Updated Vital Signs BP (!) 140/92 (BP Location: Left Arm)   Pulse 99   Temp 98.6 F (37 C) (Oral)   Resp 18   Ht 6\' 3"  (1.905 m)   Wt (!) 142.9 kg   SpO2 98%   BMI 39.37 kg/m   Visual Acuity Right Eye Distance:   Left Eye Distance:  Bilateral Distance:    Right Eye Near:   Left Eye Near:    Bilateral Near:     Physical Exam Vitals signs and nursing note reviewed.  Constitutional:      General: He is not in acute distress.    Appearance: Normal appearance.  HENT:     Head: Normocephalic and atraumatic.     Nose: Nose normal.  Eyes:     General: No scleral icterus.    Conjunctiva/sclera: Conjunctivae normal.  Cardiovascular:     Rate and Rhythm: Normal rate and regular rhythm.  Pulmonary:     Effort: Pulmonary effort is normal.     Breath sounds: Normal breath sounds.  Musculoskeletal:     Comments: Thoracic spine -patient with mild tenderness around the left scapula.  No midline tenderness.  Neurological:     Mental Status: He is alert.  Psychiatric:        Mood and Affect: Mood normal.        Behavior: Behavior normal.    UC Treatments / Results  Labs (all labs ordered are listed, but only abnormal results are displayed) Labs Reviewed - No data to display  EKG None  Radiology No results found.  Procedures Procedures (including critical care time)  Medications  Ordered in UC Medications - No data to display  Initial Impression / Assessment and Plan / UC Course  I have reviewed the triage vital signs and the nursing notes.  Pertinent labs & imaging results that were available during my care of the patient were reviewed by me and considered in my medical decision making (see chart for details).    38 year old male presents with thoracic back pain.  Rest, heat.  Meloxicam as directed.  Zanaflex as needed.  Supportive care.  Final Clinical Impressions(s) / UC Diagnoses   Final diagnoses:  Acute left-sided thoracic back pain     Discharge Instructions     Rest, heat.  Medication as prescribed.  Take care  Dr. Adriana Simas     ED Prescriptions    Medication Sig Dispense Auth. Provider   meloxicam (MOBIC) 15 MG tablet Take 1 tablet (15 mg total) by mouth daily as needed for pain. 30 tablet Derrion Tritz G, DO   tiZANidine (ZANAFLEX) 4 MG tablet Take 1 tablet (4 mg total) by mouth every 8 (eight) hours as needed for muscle spasms (Back pain). 30 tablet Tommie Sams, DO     Controlled Substance Prescriptions [place] Controlled Substance Registry consulted? Not Applicable   Tommie Sams, [place] 12/21/18 2563

## 2018-12-21 NOTE — Discharge Instructions (Signed)
Rest, heat.  Medication as prescribed.  Take care  Dr. Briggett Tuccillo  

## 2018-12-21 NOTE — ED Triage Notes (Signed)
Patient in today c/o back pain after lifting heavy rolls of fabric at work on 12/12/18. Patient has been taking Tylenol, Aleve, Ibuprofen and Aspirin without relief.

## 2020-01-29 ENCOUNTER — Emergency Department: Payer: Worker's Compensation

## 2020-01-29 ENCOUNTER — Other Ambulatory Visit: Payer: Self-pay

## 2020-01-29 ENCOUNTER — Encounter: Payer: Self-pay | Admitting: Emergency Medicine

## 2020-01-29 ENCOUNTER — Emergency Department
Admission: EM | Admit: 2020-01-29 | Discharge: 2020-01-29 | Disposition: A | Discharge status: Home / Self Care | Payer: Worker's Compensation | Attending: Emergency Medicine | Admitting: Emergency Medicine

## 2020-01-29 DIAGNOSIS — S0990XA Unspecified injury of head, initial encounter: Secondary | ICD-10-CM | POA: Diagnosis present

## 2020-01-29 DIAGNOSIS — W228XXA Striking against or struck by other objects, initial encounter: Secondary | ICD-10-CM | POA: Insufficient documentation

## 2020-01-29 DIAGNOSIS — Y990 Civilian activity done for income or pay: Secondary | ICD-10-CM | POA: Insufficient documentation

## 2020-01-29 DIAGNOSIS — Y939 Activity, unspecified: Secondary | ICD-10-CM | POA: Diagnosis not present

## 2020-01-29 DIAGNOSIS — F17210 Nicotine dependence, cigarettes, uncomplicated: Secondary | ICD-10-CM | POA: Insufficient documentation

## 2020-01-29 DIAGNOSIS — S060X1A Concussion with loss of consciousness of 30 minutes or less, initial encounter: Secondary | ICD-10-CM

## 2020-01-29 DIAGNOSIS — Y929 Unspecified place or not applicable: Secondary | ICD-10-CM | POA: Insufficient documentation

## 2020-01-29 MED ORDER — IBUPROFEN 600 MG PO TABS: 600.0000 mg | ORAL_TABLET | Freq: Four times a day (QID) | ORAL | 0 refills | Status: DC | PRN

## 2020-01-29 NOTE — ED Triage Notes (Signed)
Patient presents to the ED with headache after a heavy metal cart hit patient on the right side of the head while at work today.  Patient denies losing consciousness.  Patient states he saw stars and is still occasionally seeing floaters.  Patient is in no obvious distress at this time.  Walking with a steady gait.

## 2020-01-29 NOTE — ED Provider Notes (Signed)
[facility] Emergency Department Provider Note  ____________________________________________  Time seen: Approximately 12:28 PM  I have reviewed the triage vital signs and the nursing notes.   HISTORY  Chief Complaint Head Injury    HPI Brandon Hebert. is a 39 y.o. male that presents to the emergency department for evaluation of headache after head injury today.  Patient estimates that a 50 pound metal piece of machinery hit the right side of his head while at work today.  He did not lose consciousness but he did see stars afterwards.  He is still intermittently seeing stars but they have been decreasing in frequency.  No dizziness, visual changes.  Past Medical History:  Diagnosis Date  . Abscess of neck     There are no problems to display for this patient.   Past Surgical History:  Procedure Laterality Date  . APPENDECTOMY      Prior to Admission medications   Medication Sig Start Date End Date Taking? Authorizing Provider  ibuprofen (ADVIL) 600 MG tablet Take 1 tablet (600 mg total) by mouth every 6 (six) hours as needed. 01/29/20   Enid Derry, PA-C  meloxicam (MOBIC) 15 MG tablet Take 1 tablet (15 mg total) by mouth daily as needed for pain. 12/21/18   Tommie Sams, DO  tiZANidine (ZANAFLEX) 4 MG tablet Take 1 tablet (4 mg total) by mouth every 8 (eight) hours as needed for muscle spasms (Back pain). 12/21/18   Tommie Sams, DO    Allergies Codeine and Penicillins  Family History  Problem Relation Age of Onset  . Hypertension Mother   . Heart attack Father 73  . Cancer Neg Hx     Social History Social History   Tobacco Use  . Smoking status: Current Every Day Smoker    Packs/day: 0.20    Years: 20.00    Pack years: 4.00    Types: Cigarettes  . Smokeless tobacco: Current User    Types: Snuff  Substance Use Topics  . Alcohol use: Yes    Comment: social  . Drug use: No     Review of Systems  Cardiovascular: No chest  pain. Respiratory: No SOB. Gastrointestinal: No abdominal pain.  No nausea, no vomiting.  Musculoskeletal: Negative for musculoskeletal pain. Skin: Negative for rash, abrasions, lacerations, ecchymosis. Neurological: Negative for numbness or tingling. Positive for headache.   ____________________________________________   PHYSICAL EXAM:  VITAL SIGNS: ED Triage Vitals  Enc Vitals Group     BP 01/29/20 1120 139/87     Pulse Rate 01/29/20 1120 88     Resp 01/29/20 1120 16     Temp 01/29/20 1120 98.1 F (36.7 C)     Temp Source 01/29/20 1120 Oral     SpO2 01/29/20 1120 97 %     Weight 01/29/20 1121 (!) 322 lb (146.1 kg)     Height 01/29/20 1121 6\' 3"  (1.905 m)     Head Circumference --      Peak Flow --      Pain Score 01/29/20 1121 5     Pain Loc --      Pain Edu? --      Excl. in GC? --      Constitutional: Alert and oriented. Well appearing and in no acute distress. Eyes: Conjunctivae are normal. PERRL. EOMI. Head: Atraumatic. ENT:      Ears:      Nose: No congestion/rhinnorhea.      Mouth/Throat: Mucous membranes are moist.  Neck: No stridor.  No cervical spine tenderness to palpation. Cardiovascular: Normal rate, regular rhythm.  Good peripheral circulation. Respiratory: Normal respiratory effort without tachypnea or retractions. Lungs CTAB. Good air entry to the bases with no decreased or absent breath sounds. Gastrointestinal: Bowel sounds 4 quadrants. Soft and nontender to palpation. No guarding or rigidity. No palpable masses. No distention.  Musculoskeletal: Full range of motion to all extremities. No gross deformities appreciated. Neurologic:  Normal speech and language. No gross focal neurologic deficits are appreciated.  Skin:  Skin is warm, dry and intact. No rash noted. Psychiatric: Mood and affect are normal. Speech and behavior are normal. Patient exhibits appropriate insight and judgement.   ____________________________________________   LABS (all  labs ordered are listed, but only abnormal results are displayed)  Labs Reviewed - No data to display ____________________________________________  EKG   ____________________________________________  RADIOLOGY Lexine Baton, personally viewed and evaluated these images (plain radiographs) as part of my medical decision making, as well as reviewing the written report by the radiologist.  CT Head Wo Contrast  Result Date: 01/29/2020 CLINICAL DATA:  Headache after heavy object hit right-side of head. Suspected concussion EXAM: CT HEAD WITHOUT CONTRAST TECHNIQUE: Contiguous axial images were obtained from the base of the skull through the vertex without intravenous contrast. COMPARISON:  October 06, 2005 FINDINGS: Brain: Ventricles and sulci are normal in size and configuration. The left lateral ventricle is slightly larger than the right lateral ventricle, a stable anatomic variant. There is no intracranial mass, hemorrhage, extra-axial fluid collection, or midline shift. Brain parenchyma appears unremarkable. No acute infarct evident. Vascular: No hyperdense vessel.  No evident vascular calcification. Skull: The bony calvarium appears intact. Sinuses/Orbits: There is opacification in multiple ethmoid air cells as well as mucosal thickening in the left sphenoid and left maxillary sinus regions. Orbits appear symmetric bilaterally. Other: Mastoid air cells are clear. IMPRESSION: Multifocal paranasal sinus disease.  Study otherwise unremarkable. Electronically Signed   By: Bretta Bang III M.D.   On: 01/29/2020 12:17    ____________________________________________    PROCEDURES  Procedure(s) performed:    Procedures    Medications - No data to display   ____________________________________________   INITIAL IMPRESSION / ASSESSMENT AND PLAN / ED COURSE  Pertinent labs & imaging results that were available during my care of the patient were reviewed by me and considered in my  medical decision making (see chart for details).  Review of the [place] CSRS was performed in accordance of the [facility] prior to dispensing any controlled drugs.   Patient presented to the emergency department for evaluation after head injury.  Vital signs and exam are reassuring.  Head CT negative for intracranial abnormality.  Patient will be discharged home with prescriptions for motrin. Patient is to follow up with PCP as directed. Patient is given ED precautions to return to the ED for any worsening or new symptoms.   Brandon Hebert. was evaluated in Emergency Department on 01/29/2020 for the symptoms described in the history of present illness. He was evaluated in the context of the global COVID-19 pandemic, which necessitated consideration that the patient might be at risk for infection with the SARS-CoV-2 virus that causes COVID-19. Institutional protocols and algorithms that pertain to the evaluation of patients at risk for COVID-19 are in a state of rapid change based on information released by regulatory bodies including the CDC and federal and state organizations. These policies and algorithms were followed during the patient's care in the ED.  ____________________________________________  FINAL CLINICAL IMPRESSION(S) / ED DIAGNOSES  Final diagnoses:  Injury of head, initial encounter  Concussion with loss of consciousness of 30 minutes or less, initial encounter      NEW MEDICATIONS STARTED DURING THIS VISIT:  ED Discharge Orders         Ordered    ibuprofen (ADVIL) 600 MG tablet  Every 6 hours PRN     01/29/20 1301              This chart was dictated using voice recognition software/Dragon. Despite best efforts to proofread, errors can occur which can change the meaning. Any change was purely unintentional.    Laban Emperor, PA-C 01/29/20 1528    Harvest Dark, MD 01/30/20 [phone]

## 2020-06-02 ENCOUNTER — Telehealth: Payer: Self-pay | Admitting: General Practice

## 2020-06-02 NOTE — Telephone Encounter (Signed)
Individual has been contacted 3+ times regarding ED referral. No further attempts to contact individual will be made. 

## 2020-09-26 ENCOUNTER — Other Ambulatory Visit: Payer: Self-pay

## 2020-09-26 ENCOUNTER — Encounter: Payer: Self-pay | Admitting: Emergency Medicine

## 2020-09-26 ENCOUNTER — Emergency Department: Payer: BC Managed Care – PPO

## 2020-09-26 ENCOUNTER — Emergency Department
Admission: EM | Admit: 2020-09-26 | Discharge: 2020-09-27 | Disposition: A | Discharge status: Home / Self Care | Payer: BC Managed Care – PPO | Attending: Emergency Medicine | Admitting: Emergency Medicine

## 2020-09-26 DIAGNOSIS — R110 Nausea: Secondary | ICD-10-CM | POA: Insufficient documentation

## 2020-09-26 DIAGNOSIS — R0789 Other chest pain: Secondary | ICD-10-CM | POA: Insufficient documentation

## 2020-09-26 DIAGNOSIS — R61 Generalized hyperhidrosis: Secondary | ICD-10-CM | POA: Diagnosis not present

## 2020-09-26 DIAGNOSIS — R002 Palpitations: Secondary | ICD-10-CM | POA: Diagnosis not present

## 2020-09-26 DIAGNOSIS — F17210 Nicotine dependence, cigarettes, uncomplicated: Secondary | ICD-10-CM | POA: Insufficient documentation

## 2020-09-26 DIAGNOSIS — R079 Chest pain, unspecified: Secondary | ICD-10-CM

## 2020-09-26 DIAGNOSIS — R0602 Shortness of breath: Secondary | ICD-10-CM | POA: Diagnosis not present

## 2020-09-26 HISTORY — DX: Psoriasis, unspecified: L40.9

## 2020-09-26 HISTORY — DX: Nicotine dependence, unspecified, uncomplicated: F17.200

## 2020-09-26 LAB — CBC
HCT: 51.6 % (ref 39.0–52.0)
Hemoglobin: 17.2 g/dL — ABNORMAL HIGH (ref 13.0–17.0)
MCH: 28.6 pg (ref 26.0–34.0)
MCHC: 33.3 g/dL (ref 30.0–36.0)
MCV: 85.7 fL (ref 80.0–100.0)
Platelets: 397 10*3/uL (ref 150–400)
RBC: 6.02 MIL/uL — ABNORMAL HIGH (ref 4.22–5.81)
RDW: 13.2 % (ref 11.5–15.5)
WBC: 16.8 10*3/uL — ABNORMAL HIGH (ref 4.0–10.5)
nRBC: 0 % (ref 0.0–0.2)

## 2020-09-26 LAB — BASIC METABOLIC PANEL
Anion gap: 11 (ref 5–15)
BUN: 21 mg/dL — ABNORMAL HIGH (ref 6–20)
CO2: 25 mmol/L (ref 22–32)
Calcium: 9.5 mg/dL (ref 8.9–10.3)
Chloride: 104 mmol/L (ref 98–111)
Creatinine, Ser: 1.33 mg/dL — ABNORMAL HIGH (ref 0.61–1.24)
GFR, Estimated: >60 mL/min (ref >60)
Glucose, Bld: 134 mg/dL — ABNORMAL HIGH (ref 70–99)
Potassium: 3.8 mmol/L (ref 3.5–5.1)
Sodium: 140 mmol/L (ref 135–145)

## 2020-09-26 LAB — PROTIME-INR
INR: 0.9 (ref 0.8–1.2)
Prothrombin Time: 11.9 seconds (ref 11.4–15.2)

## 2020-09-26 LAB — TROPONIN I (HIGH SENSITIVITY): Troponin I (High Sensitivity): <2 ng/L (ref <18)

## 2020-09-26 LAB — BRAIN NATRIURETIC PEPTIDE: B Natriuretic Peptide: 22.6 pg/mL (ref 0.0–100.0)

## 2020-09-26 LAB — LIPASE, BLOOD: Lipase: 33 U/L (ref 11–51)

## 2020-09-26 MED ORDER — IOHEXOL 350 MG/ML SOLN
100.0000 mL | Freq: Once | INTRAVENOUS | Status: AC | PRN
  Administered 2020-09-26: 100 mL via INTRAVENOUS

## 2020-09-26 MED ORDER — LACTATED RINGERS IV BOLUS
1000.0000 mL | Freq: Once | INTRAVENOUS | Status: AC
  Administered 2020-09-26: 1000 mL via INTRAVENOUS

## 2020-09-26 NOTE — ED Triage Notes (Signed)
Patient presents to Emergency Department via [facility] from HOME with complaints of center chest squeezing pain varies from 6-10, with a low BP of 86/46 and 2 other BPs in the 100s, pt reports sweaty and SOB. Pt appears sleepy.  Pt denies drug use  EMS gave SL nitro (no change to pain) and pt took 4 x 81 ASA prior to EMS  No cardiac hx

## 2020-09-26 NOTE — ED Triage Notes (Signed)
2 lpm Brandon Hebert O2 added, pt reports "all energy just drained out of me"

## 2020-09-26 NOTE — ED Notes (Signed)
Patient transported to X-ray 

## 2020-09-27 LAB — TROPONIN I (HIGH SENSITIVITY): Troponin I (High Sensitivity): <2 ng/L (ref <18)

## 2020-09-27 MED ORDER — KETOROLAC TROMETHAMINE 30 MG/ML IJ SOLN
15.0000 mg | Freq: Once | INTRAMUSCULAR | Status: AC
  Administered 2020-09-27: 15 mg via INTRAVENOUS
  Filled 2020-09-27: qty 1

## 2020-09-27 NOTE — ED Provider Notes (Signed)
[facility] Provider Note ____________________________________________   First MD Initiated Contact with Patient 09/26/20 2105     (approximate)  I have reviewed the triage vital signs and the nursing notes.  HISTORY  Chief Complaint Chest Pain   HPI Brandon Hebert. is a 39 y.o. malewho presents to the ED for evaluation of chest pain  Chart review indicates no relevant medical history.  Patient denies any cardiac history.  Does not take any regular prescription medications.  No recent illnesses.  Patient reports being in his typical state of health while he was seated eating dinner tonight when he developed sudden onset of diaphoresis, shortness of breath and chest discomfort.  He reports associated heart palpitations and nausea without vomiting.  Denies syncope, headache, trauma, cough.  Patient denies taking any recreational drugs. Father at the bedside provides additional history and indicates that patient has lived with him for the past year.   Past Medical History:  Diagnosis Date  . Abscess of neck   . Psoriasis   . Smoker     There are no problems to display for this patient.   Past Surgical History:  Procedure Laterality Date  . APPENDECTOMY      Prior to Admission medications   Medication Sig Start Date End Date Taking? Authorizing Provider  ibuprofen (ADVIL) 600 MG tablet Take 1 tablet (600 mg total) by mouth every 6 (six) hours as needed. 01/29/20  Yes Enid Derry, PA-C  meloxicam (MOBIC) 15 MG tablet Take 1 tablet (15 mg total) by mouth daily as needed for pain. 12/21/18   Tommie Sams, DO  tiZANidine (ZANAFLEX) 4 MG tablet Take 1 tablet (4 mg total) by mouth every 8 (eight) hours as needed for muscle spasms (Back pain). 12/21/18   Tommie Sams, DO    Allergies Codeine and Penicillins  Family History  Problem Relation Age of Onset  . Hypertension Mother   . Heart attack Father 35  . Cancer Neg Hx       Social History Social History   Tobacco Use  . Smoking status: Current Every Day Smoker    Packs/day: 0.20    Years: 20.00    Pack years: 4.00    Types: Cigarettes  . Smokeless tobacco: Current User    Types: Snuff  Vaping Use  . Vaping Use: Never used  Substance Use Topics  . Alcohol use: Yes    Comment: social  . Drug use: No    Review of Systems  Constitutional: No fever/chills Eyes: No visual changes. ENT: No sore throat. Cardiovascular: Positive chest pain Respiratory: Positive for shortness of breath. Gastrointestinal: No abdominal pain.  No nausea, no vomiting.  No diarrhea.  No constipation. Genitourinary: Negative for dysuria. Musculoskeletal: Negative for back pain. Skin: Negative for rash. Neurological: Negative for headaches, focal weakness or numbness.  ____________________________________________   PHYSICAL EXAM:  VITAL SIGNS: Vitals:   09/26/20 2130 09/26/20 2300  BP: 127/87 136/70  Pulse: 79 95  Resp: (!) 23 16  Temp:    SpO2: 96% 98%     Constitutional: Alert and oriented. Well appearing and in no acute distress. Eyes: Conjunctivae are normal. PERRL. EOMI. Head: Atraumatic. Nose: No congestion/rhinnorhea. Mouth/Throat: Mucous membranes are moist.  Oropharynx non-erythematous. Neck: No stridor. No cervical spine tenderness to palpation. Cardiovascular: Normal rate, regular rhythm. Grossly normal heart sounds.  Good peripheral circulation. Respiratory: Normal respiratory effort.  No retractions. Lungs CTAB. Gastrointestinal: Soft , nondistended, nontender to palpation.  No CVA tenderness. Musculoskeletal: No lower extremity tenderness nor edema.  No joint effusions. No signs of acute trauma. Neurologic:  Normal speech and language. No gross focal neurologic deficits are appreciated. No gait instability noted. Skin:  Skin is warm, dry and intact. No rash noted. Psychiatric: Mood and affect are normal. Speech and behavior are  normal.  ____________________________________________   LABS (all labs ordered are listed, but only abnormal results are displayed)  Labs Reviewed  BASIC METABOLIC PANEL - Abnormal; Notable for the following components:      Result Value   Glucose, Bld 134 (*)    BUN 21 (*)    Creatinine, Ser 1.33 (*)    All other components within normal limits  CBC - Abnormal; Notable for the following components:   WBC 16.8 (*)    RBC 6.02 (*)    Hemoglobin 17.2 (*)    All other components within normal limits  LIPASE, BLOOD  PROTIME-INR  BRAIN NATRIURETIC PEPTIDE  TROPONIN I (HIGH SENSITIVITY)  TROPONIN I (HIGH SENSITIVITY)   ____________________________________________  12 Lead EKG  Sinus rhythm, rate of 86 bpm.  Normal axis and intervals.  No evidence of acute ischemia. ____________________________________________  RADIOLOGY  ED MD interpretation: 2 view CXR reviewed by me without evidence of acute cardiopulmonary culture.  Official radiology report(s): DG Chest 2 View  Result Date: 09/26/2020 CLINICAL DATA:  Chest pain and shortness of breath. EXAM: CHEST - 2 VIEW COMPARISON:  12/13/2012 FINDINGS: Lung volumes are low. There is mild elevation of the right hemidiaphragm. Prominent heart size is likely accentuated by low lung volumes and portable AP technique. Mild interstitial prominence. No pleural fluid or pneumothorax. No confluent consolidation. No acute osseous abnormalities are seen. IMPRESSION: 1. Low lung volumes with prominent heart size, likely accentuated by low lung volumes and portable AP technique. 2. Diffuse interstitial prominence that may represent pulmonary edema. Electronically Signed   By: Narda Rutherford M.D.   On: 09/26/2020 21:25   CT Angio Chest PE W and/or Wo Contrast  Result Date: 09/26/2020 CLINICAL DATA:  Chest pain and hypoxia EXAM: CT ANGIOGRAPHY CHEST WITH CONTRAST TECHNIQUE: Multidetector CT imaging of the chest was performed using the standard  protocol during bolus administration of intravenous contrast. Multiplanar CT image reconstructions and MIPs were obtained to evaluate the vascular anatomy. CONTRAST:  OMNIPAQUE IOHEXOL 350 MG/ML SOLN COMPARISON:  None. FINDINGS: Cardiovascular: There is a optimal opacification of the pulmonary arteries. There is no central,segmental, or subsegmental filling defects within the pulmonary arteries. The heart is normal in size. No pericardial effusion or thickening. No evidence right heart strain. There is normal three-vessel brachiocephalic anatomy without proximal stenosis. The thoracic aorta is normal in appearance. Mediastinum/Nodes: No hilar, mediastinal, or axillary adenopathy. Thyroid gland, trachea, and esophagus demonstrate no significant findings. Lungs/Pleura: Minimal bibasilar hazy ground-glass opacity is noted, likely atelectasis. No pleural effusion or pneumothorax. No airspace consolidation. Upper Abdomen: No acute abnormalities present in the visualized portions of the upper abdomen. Musculoskeletal: No chest wall abnormality. No acute or significant osseous findings. Review of the MIP images confirms the above findings. IMPRESSION: No central, segmental, or subsegmental pulmonary embolism. Minimal bibasilar dependent atelectasis, however no other acute intrathoracic pathology to explain the patient's symptoms. Electronically Signed   By: Jonna Clark M.D.   On: 09/26/2020 22:56    ____________________________________________   PROCEDURES and INTERVENTIONS  Procedure(s) performed (including Critical Care):  Procedures  Medications  ketorolac (TORADOL) 30 MG/ML injection 15 mg (has no administration in time  range)  lactated ringers bolus 1,000 mL (1,000 mLs Intravenous New Bag/Given 09/26/20 2158)  iohexol (OMNIPAQUE) 350 MG/ML injection 100 mL (100 mLs Intravenous Contrast Given 09/26/20 2237)    ____________________________________________   MDM / ED COURSE   Otherwise healthy  39 year old male presents to the ED with sudden onset chest pain or shortness of breath of uncertain etiology.  Patient initially hypoxic to the upper 80s when he presents, but this normalizes and vitals otherwise normal.  Exam does not demonstrate any acute pathology.  Blood work reveals some mild stigmata of dehydration, otherwise unremarkable.  CXR shows no infiltrates, PTX or pathology.  CTA chest obtained to evaluate for acute PE, and does not show evidence of such or any acute pathology.  Patient is resolving chest pain in the ED after IV fluid administration.  His EKG shows no ischemic features and his first troponin is negative.  We will repeat his troponin and if this continues to be normal, plan for discharge with outpatient follow-up.  Return precautions for the ED were discussed.  Patient signed out to oncoming provider to facilitate repeat troponin follow-up.   Clinical Course as of Nov 07 0001  Wynelle Link Sep 27, 2020  0000 Reassessed.  Patient reports he feels better and resolving chest pain.  We discussed the CTA chest being normal.  We discussed cardiac testing and need for repeat troponin, and he is agreeable to stay for this.   [DS]    Clinical Course User Index [DS] Delton Prairie, MD    ____________________________________________   FINAL CLINICAL IMPRESSION(S) / ED DIAGNOSES  Final diagnoses:  Chest pain     ED Discharge Orders    None       Raef Sprigg Katrinka Blazing   Note:  This document was prepared using Dragon voice recognition software and may include unintentional dictation errors.   Delton Prairie, MD 09/27/20 [phone]

## 2020-09-27 NOTE — ED Notes (Signed)
E signature pad not working. Pt educated on discharge instructions and verbalized understanding.  

## 2020-09-27 NOTE — ED Provider Notes (Signed)
-----------------------------------------   2:34 AM on 09/27/2020 -----------------------------------------  Patient's repeat troponin is less than 2.  I called lab as the result was not posted on [facility] likely due to downtime.  Confirm the 1:36 blood drawl troponin is less than 2.  Patient will be discharged home with PCP follow-up.   Minna Antis, MD 09/27/20 [phone]

## 2020-10-06 ENCOUNTER — Ambulatory Visit
Admission: EM | Admit: 2020-10-06 | Discharge: 2020-10-06 | Disposition: A | Discharge status: Home / Self Care | Payer: BC Managed Care – PPO | Attending: Emergency Medicine | Admitting: Emergency Medicine

## 2020-10-06 ENCOUNTER — Other Ambulatory Visit: Payer: Self-pay

## 2020-10-06 ENCOUNTER — Encounter: Payer: Self-pay | Admitting: Emergency Medicine

## 2020-10-06 DIAGNOSIS — M25461 Effusion, right knee: Secondary | ICD-10-CM | POA: Insufficient documentation

## 2020-10-06 DIAGNOSIS — M109 Gout, unspecified: Secondary | ICD-10-CM | POA: Diagnosis present

## 2020-10-06 LAB — BASIC METABOLIC PANEL
Anion gap: 12 (ref 5–15)
BUN: 13 mg/dL (ref 6–20)
CO2: 22 mmol/L (ref 22–32)
Calcium: 9.2 mg/dL (ref 8.9–10.3)
Chloride: 104 mmol/L (ref 98–111)
Creatinine, Ser: 1.16 mg/dL (ref 0.61–1.24)
GFR, Estimated: >60 mL/min (ref >60)
Glucose, Bld: 89 mg/dL (ref 70–99)
Potassium: 4 mmol/L (ref 3.5–5.1)
Sodium: 138 mmol/L (ref 135–145)

## 2020-10-06 LAB — URIC ACID: Uric Acid, Serum: 10 mg/dL — ABNORMAL HIGH (ref 3.7–8.6)

## 2020-10-06 MED ORDER — PREDNISONE 20 MG PO TABS: 40.0000 mg | ORAL_TABLET | Freq: Every day | ORAL | 0 refills | Status: AC

## 2020-10-06 MED ORDER — NAPROXEN 500 MG PO TABS: 500.0000 mg | ORAL_TABLET | Freq: Two times a day (BID) | ORAL | 0 refills | Status: DC

## 2020-10-06 MED ORDER — COLCHICINE 0.6 MG PO TABS: ORAL_TABLET | ORAL | 0 refills | Status: DC

## 2020-10-06 NOTE — Discharge Instructions (Addendum)
I have sent off a basic metabolic panel to check your kidney function and a uric acid which will help confirm a diagnosis of gout.  Follow-up with [facility] as soon as you can have this aspirated and analyzed.  Take the colchicine, Naprosyn and prednisone as directed.  You may take at 1000 mg of Tylenol with the Aleve twice a day.  May take an additional 1000 mg of Tylenol 1 or 2 times per day for a maximum of 4000 mg of Tylenol in 24 hours.  Do not take any more than this as it can hurt your liver.

## 2020-10-06 NOTE — ED Triage Notes (Signed)
Patient c/o right knee pain and swelling that started 2 months ago. He does report a history of gout and arthritis.

## 2020-10-06 NOTE — ED Provider Notes (Signed)
HPI  SUBJECTIVE:  Brandon [facility]. is a 39 y.o. male who presents with intermittent right knee swelling for the past 2 months.  He states it became swollen again yesterday.  He reports constant achy joint pain primarily located behind his patella.  He states that it is giving way due to pain.  No change in his physical activity, trauma to the knee.  Reports increased joint temperature.  No fevers, body aches, joint erythema.  No popping, clicking.  No hypersensitivity, distal numbness or tingling.  He took ibuprofen and a single Aleve this morning.  He denies alcohol intake, states that he quit eating red meat and seafood.  He has tried black cherry juice, ibuprofen and Aleve.  The Aleve helps.  Symptoms worse with weightbearing and pushing on his patella.  He has a past medical history of gout which he says was diagnosed through an joint aspiration.  He is a former smoker.  No history of diabetes, hypertension, chronic kidney disease.  PMD: Dr. Romeo Apple   Past Medical History:  Diagnosis Date  . Abscess of neck   . Psoriasis   . Smoker     Past Surgical History:  Procedure Laterality Date  . APPENDECTOMY      Family History  Problem Relation Age of Onset  . Hypertension Mother   . Heart attack Father 64  . Cancer Neg Hx     Social History   Tobacco Use  . Smoking status: Current Every Day Smoker    Packs/day: 0.20    Years: 20.00    Pack years: 4.00    Types: Cigarettes  . Smokeless tobacco: Current User    Types: Snuff  Vaping Use  . Vaping Use: Never used  Substance Use Topics  . Alcohol use: Yes    Comment: social  . Drug use: No    No current facility-administered medications for this encounter.  Current Outpatient Medications:  .  colchicine 0.6 MG tablet, 2 tabs po x 1, then one tab po 1 hour later, Disp: 6 tablet, Rfl: 0 .  naproxen (NAPROSYN) 500 MG tablet, Take 1 tablet (500 mg total) by mouth 2 (two) times daily., Disp: 20 tablet, Rfl: 0 .  predniSONE  (DELTASONE) 20 MG tablet, Take 2 tablets (40 mg total) by mouth daily with breakfast for 5 days., Disp: 10 tablet, Rfl: 0  Allergies  Allergen Reactions  . Codeine     hallucinations  . Penicillins     "almost killed me"     ROS  As noted in HPI.   Physical Exam  BP (!) 127/100 (BP Location: Right Arm)   Pulse 94   Temp 98.5 F (36.9 C) (Oral)   Resp 18   Ht 6\' 3"  (1.905 m)   Wt (!) 145.6 kg   SpO2 98%   BMI 40.12 kg/m   Constitutional: Well developed, well nourished, no acute distress Eyes:  EOMI, conjunctiva normal bilaterally HENT: Normocephalic, atraumatic,mucus membranes moist Respiratory: Normal inspiratory effort Cardiovascular: Normal rate GI: nondistended skin: No rash, skin intact Musculoskeletal: Large effusion right knee.  Positive tenderness superior to the patella at the insertion of the quadriceps.  Tenderness around the patella.  No other tenderness.  No crepitus.  Knee ROM baseline for Pt, Flexion  intact , Patella NT,  Patellar tendon NT, Medial joint NT, Lateral joint NT, Popliteal region NT, Varus MCL stress testing stable, Valgus LCL stress testing stable, McMurray's testing normal , Lachman's negative. Distal NVI with intact baseline  sensation / motor / pulse distal to knee.  No erythema. No increased temperature. Neurologic: Alert & oriented x 3, no focal neuro deficits Psychiatric: Speech and behavior appropriate   ED Course   Medications - No data to display  Orders Placed This Encounter  Procedures  . Uric acid    Standing Status:   Standing    Number of Occurrences:   1  . Basic metabolic panel    Standing Status:   Standing    Number of Occurrences:   1    Results for orders placed or performed during the hospital encounter of 10/06/20 (from the past 24 hour(s))  Uric acid     Status: Abnormal   Collection Time: 10/06/20 12:57 PM  Result Value Ref Range   Uric Acid, Serum 10.0 (H) 3.7 - 8.6 mg/dL  Basic metabolic panel      Status: None   Collection Time: 10/06/20 12:57 PM  Result Value Ref Range   Sodium 138 135 - 145 mmol/L   Potassium 4.0 3.5 - 5.1 mmol/L   Chloride 104 98 - 111 mmol/L   CO2 22 22 - 32 mmol/L   Glucose, Bld 89 70 - 99 mg/dL   BUN 13 6 - 20 mg/dL   Creatinine, Ser 4.40 0.61 - 1.24 mg/dL   Calcium 9.2 8.9 - 10.2 mg/dL   GFR, Estimated >72 >53 mL/min   Anion gap 12 5 - 15   No results found.  ED Clinical Impression  1. Effusion of right knee   2. Acute gout of right knee, unspecified cause      ED Assessment/Plan  Previous labs, records and outside records reviewed.  Unable to find a diagnosis of gout or uric acid level.  However patient states that this was done sometime ago through an aspirate.  Patient has a large effusion.  No evidence of septic joint.  Given his presumed diagnosis of gout, will try colchicine 1.2 mg x 1, then 0.6x1, Naprosyn, prednisone.  Referring to [facility] urgent care in 1 to 2 days to have this aspirated/drained and have this sent off for definitive analysis to confirm diagnosis of gout.  Recent creatinine 1.33 as of 11/6.  Will check a uric acid level repeat kidney function to guide NSAID treatment.  Wonder if the renal insufficiency is contributing to his gout.  Calculated creatinine clearance 70 mL/minute.  Do not need to adjust naprosyn or colchicine dosing.  Deferring x-ray in the absence of trauma or bony tenderness.  Labs reviewed.  Elevated uric acid, his creatinine has normalized.  Discussed these results with patient and answered all questions.  Discussed  MDM, treatment plan, and plan for follow-up with patient.  Discussed with him that the best way to relieve his symptoms is to have this aspirated which can be done at [facility].  Discussed sn/sx that should prompt return to the ED. patient agrees with plan.   Meds ordered this encounter  Medications  . colchicine 0.6 MG tablet    Sig: 2 tabs po x 1, then one tab po 1 hour later     Dispense:  6 tablet    Refill:  0  . predniSONE (DELTASONE) 20 MG tablet    Sig: Take 2 tablets (40 mg total) by mouth daily with breakfast for 5 days.    Dispense:  10 tablet    Refill:  0  . naproxen (NAPROSYN) 500 MG tablet    Sig: Take 1 tablet (500 mg total) by mouth  2 (two) times daily.    Dispense:  20 tablet    Refill:  0    *This clinic note was created using Scientist, clinical (histocompatibility and immunogenetics). Therefore, there may be occasional mistakes despite careful proofreading.   ?    Domenick Gong, MD 10/07/20 [phone]

## 2021-09-27 IMAGING — DX DG CHEST 2V
1 series · 1 of 1 positions shown · non-contrast
Comparison: 12/13/2012

CLINICAL DATA: Chest pain and shortness of breath.

EXAM:
CHEST - 2 VIEW

[chest ap]
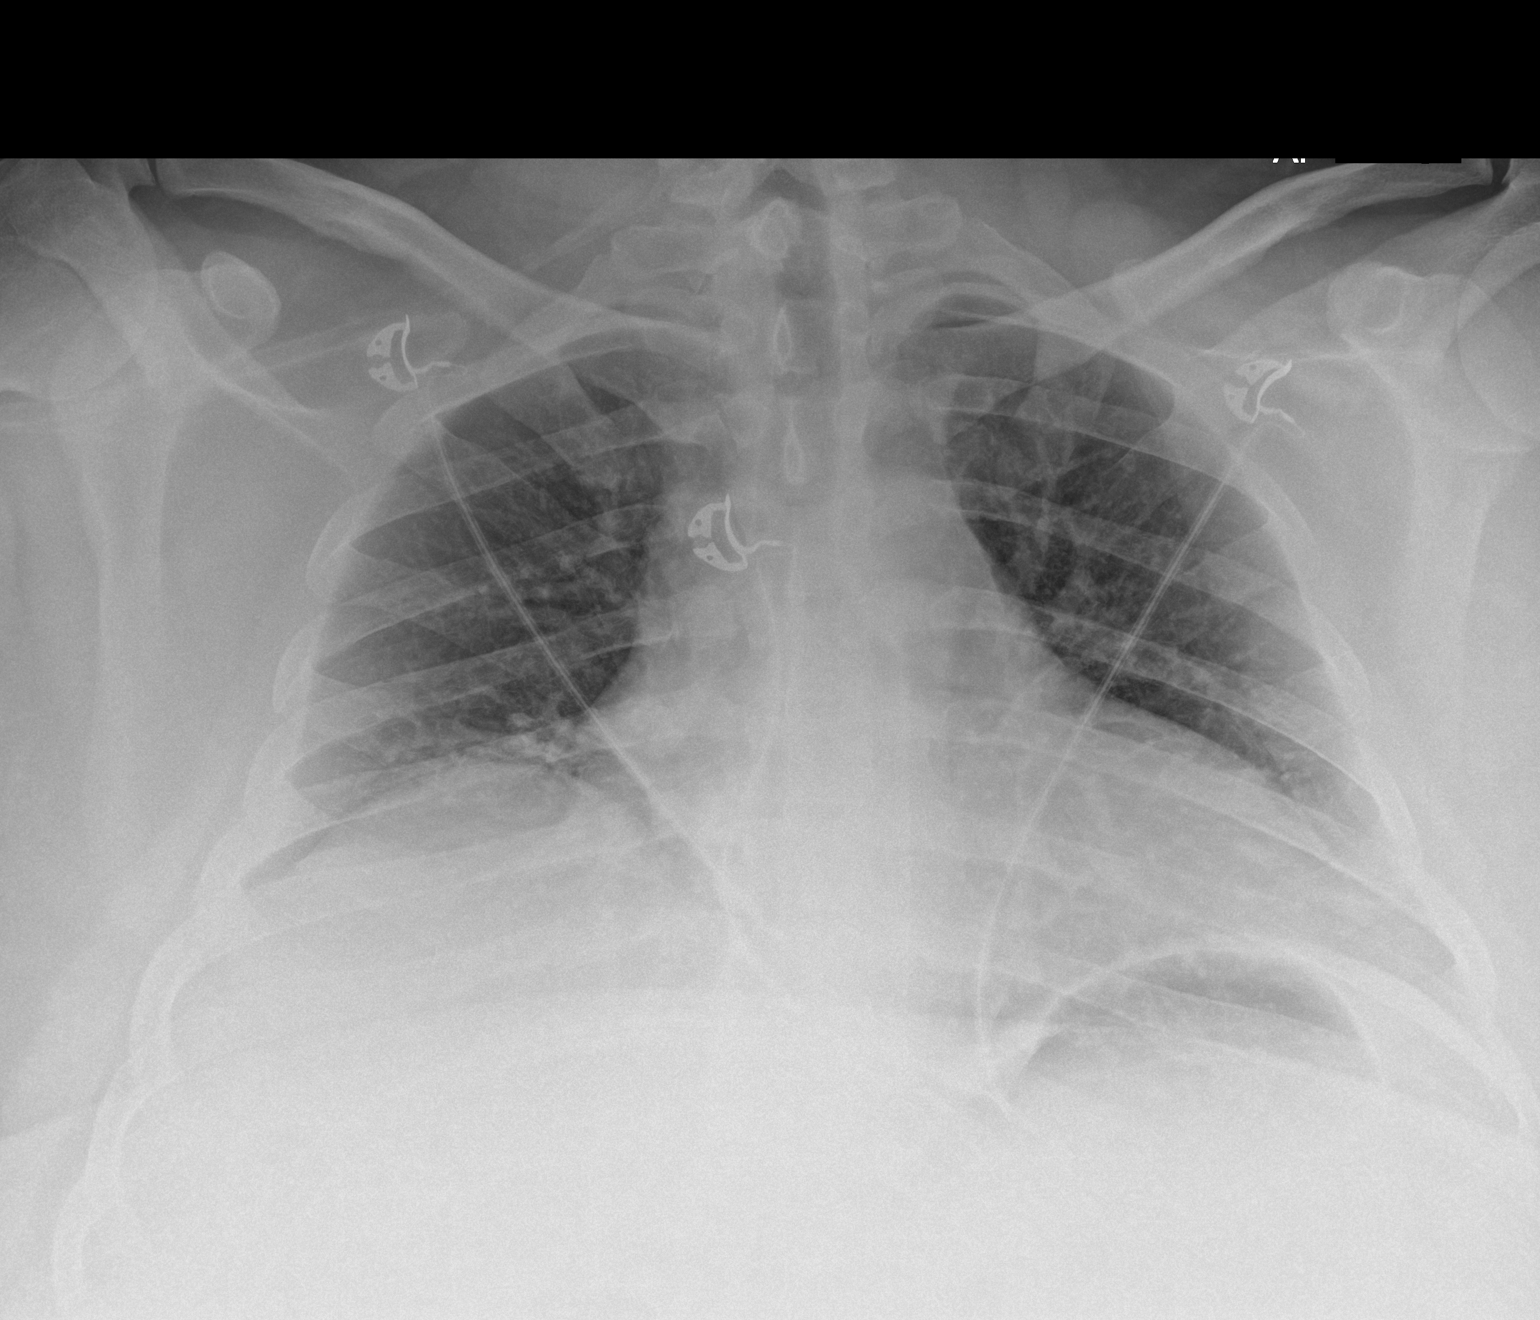

[1 of 1 positions shown; findings below may reference images not displayed]

FINDINGS: Lung volumes are low. There is mild elevation of the right
hemidiaphragm. Prominent heart size is likely accentuated by low
lung volumes and portable AP technique. Mild interstitial
prominence. No pleural fluid or pneumothorax. No confluent
consolidation. No acute osseous abnormalities are seen.
IMPRESSION: 1. Low lung volumes with prominent heart size, likely accentuated by
low lung volumes and portable AP technique.
2. Diffuse interstitial prominence that may represent pulmonary
edema.

## 2021-11-29 ENCOUNTER — Emergency Department: Payer: 59

## 2021-11-29 ENCOUNTER — Other Ambulatory Visit: Payer: Self-pay

## 2021-11-29 DIAGNOSIS — I200 Unstable angina: Principal | ICD-10-CM | POA: Insufficient documentation

## 2021-11-29 DIAGNOSIS — Z20822 Contact with and (suspected) exposure to covid-19: Secondary | ICD-10-CM | POA: Diagnosis not present

## 2021-11-29 DIAGNOSIS — F17210 Nicotine dependence, cigarettes, uncomplicated: Secondary | ICD-10-CM | POA: Diagnosis not present

## 2021-11-29 DIAGNOSIS — I10 Essential (primary) hypertension: Secondary | ICD-10-CM | POA: Diagnosis not present

## 2021-11-29 DIAGNOSIS — R079 Chest pain, unspecified: Secondary | ICD-10-CM | POA: Diagnosis present

## 2021-11-29 LAB — TROPONIN I (HIGH SENSITIVITY)
Troponin I (High Sensitivity): 13 ng/L (ref <18)
Troponin I (High Sensitivity): 15 ng/L (ref <18)

## 2021-11-29 LAB — BASIC METABOLIC PANEL
Anion gap: 7 (ref 5–15)
BUN: 15 mg/dL (ref 6–20)
CO2: 28 mmol/L (ref 22–32)
Calcium: 9.2 mg/dL (ref 8.9–10.3)
Chloride: 103 mmol/L (ref 98–111)
Creatinine, Ser: 1.22 mg/dL (ref 0.61–1.24)
GFR, Estimated: >60 mL/min (ref >60)
Glucose, Bld: 84 mg/dL (ref 70–99)
Potassium: 3.9 mmol/L (ref 3.5–5.1)
Sodium: 138 mmol/L (ref 135–145)

## 2021-11-29 LAB — CBC
HCT: 48.3 % (ref 39.0–52.0)
Hemoglobin: 15.7 g/dL (ref 13.0–17.0)
MCH: 27.8 pg (ref 26.0–34.0)
MCHC: 32.5 g/dL (ref 30.0–36.0)
MCV: 85.6 fL (ref 80.0–100.0)
Platelets: 340 10*3/uL (ref 150–400)
RBC: 5.64 MIL/uL (ref 4.22–5.81)
RDW: 13.2 % (ref 11.5–15.5)
WBC: 13.6 10*3/uL — ABNORMAL HIGH (ref 4.0–10.5)
nRBC: 0 % (ref 0.0–0.2)

## 2021-11-29 NOTE — ED Triage Notes (Signed)
Pt here with CP since Sat. Pt went to work today and was SOB and having severe CP and pain in his left arm with some numbness. Pt states pain is left sided with left arm radiation. Pt denies N/V. Pt in NAD in triage.

## 2021-11-29 NOTE — ED Provider Triage Note (Signed)
Emergency Medicine Provider Triage Evaluation Note  Brandon Hebert. , a 41 y.o. male  was evaluated in triage.  Pt complains of chest pain and left arm numbness. Symptoms started 2 days ago. Having shortness of breath as well. No nausea or vomiting.  Review of Systems  Positive: Chest pain, shortness of breath, left arm numbness Negative: Fever, cough  Physical Exam  BP (!) 147/86 (BP Location: Right Arm)    Pulse 80    Temp 98.8 F (37.1 C) (Oral)    Resp 18    SpO2 94%  Gen:   Awake, no distress   Resp:  Normal effort  MSK:   Moves extremities without difficulty  Other:    Medical Decision Making  Medically screening exam initiated at 1:36 PM.  Appropriate orders placed.  Brandon Hebert. was informed that the remainder of the evaluation will be completed by another provider, this initial triage assessment does not replace that evaluation, and the importance of remaining in the ED until their evaluation is complete.   Brandon Pester, FNP 11/29/21 1555

## 2021-11-30 ENCOUNTER — Observation Stay
Admission: EM | Admit: 2021-11-30 | Discharge: 2021-12-01 | Disposition: A | Discharge status: Home / Self Care | Payer: 59 | Attending: Internal Medicine | Admitting: Internal Medicine

## 2021-11-30 DIAGNOSIS — Z8249 Family history of ischemic heart disease and other diseases of the circulatory system: Secondary | ICD-10-CM

## 2021-11-30 DIAGNOSIS — I200 Unstable angina: Secondary | ICD-10-CM | POA: Diagnosis not present

## 2021-11-30 DIAGNOSIS — I10 Essential (primary) hypertension: Secondary | ICD-10-CM

## 2021-11-30 DIAGNOSIS — E785 Hyperlipidemia, unspecified: Secondary | ICD-10-CM

## 2021-11-30 DIAGNOSIS — F17200 Nicotine dependence, unspecified, uncomplicated: Secondary | ICD-10-CM

## 2021-11-30 LAB — LIPID PANEL
Cholesterol: 209 mg/dL — ABNORMAL HIGH (ref 0–200)
HDL: 23 mg/dL — ABNORMAL LOW (ref >40)
LDL Cholesterol: 142 mg/dL — ABNORMAL HIGH (ref 0–99)
Total CHOL/HDL Ratio: 9.1 RATIO
Triglycerides: 222 mg/dL — ABNORMAL HIGH (ref <150)
VLDL: 44 mg/dL — ABNORMAL HIGH (ref 0–40)

## 2021-11-30 LAB — D-DIMER, QUANTITATIVE: D-Dimer, Quant: 0.38 ug/mL-FEU (ref 0.00–0.50)

## 2021-11-30 LAB — HEPARIN LEVEL (UNFRACTIONATED)
Heparin Unfractionated: 0.19 IU/mL — ABNORMAL LOW (ref 0.30–0.70)
Heparin Unfractionated: 0.23 IU/mL — ABNORMAL LOW (ref 0.30–0.70)

## 2021-11-30 LAB — RESP PANEL BY RT-PCR (FLU A&B, COVID) ARPGX2
Influenza A by PCR: NEGATIVE
Influenza B by PCR: NEGATIVE
SARS Coronavirus 2 by RT PCR: NEGATIVE

## 2021-11-30 LAB — CBC
HCT: 44.3 % (ref 39.0–52.0)
Hemoglobin: 14.9 g/dL (ref 13.0–17.0)
MCH: 28.4 pg (ref 26.0–34.0)
MCHC: 33.6 g/dL (ref 30.0–36.0)
MCV: 84.4 fL (ref 80.0–100.0)
Platelets: 279 10*3/uL (ref 150–400)
RBC: 5.25 MIL/uL (ref 4.22–5.81)
RDW: 13.1 % (ref 11.5–15.5)
WBC: 11.5 10*3/uL — ABNORMAL HIGH (ref 4.0–10.5)
nRBC: 0 % (ref 0.0–0.2)

## 2021-11-30 LAB — TROPONIN I (HIGH SENSITIVITY)
Troponin I (High Sensitivity): 9 ng/L (ref <18)
Troponin I (High Sensitivity): 8 ng/L (ref <18)
Troponin I (High Sensitivity): 9 ng/L (ref <18)
Troponin I (High Sensitivity): 8 ng/L (ref <18)

## 2021-11-30 LAB — PROTIME-INR
INR: 1.1 (ref 0.8–1.2)
Prothrombin Time: 13.7 seconds (ref 11.4–15.2)

## 2021-11-30 LAB — APTT: aPTT: 35 seconds (ref 24–36)

## 2021-11-30 LAB — HIV ANTIBODY (ROUTINE TESTING W REFLEX): HIV Screen 4th Generation wRfx: NONREACTIVE

## 2021-11-30 MED ORDER — NITROGLYCERIN 0.4 MG SL SUBL
0.4000 mg | SUBLINGUAL_TABLET | SUBLINGUAL | Status: DC | PRN
  Administered 2021-11-30: 0.4 mg via SUBLINGUAL
  Filled 2021-11-30 (×2): qty 1

## 2021-11-30 MED ORDER — ASPIRIN EC 81 MG PO TBEC
81.0000 mg | DELAYED_RELEASE_TABLET | Freq: Every day | ORAL | Status: DC
  Administered 2021-12-01: 81 mg via ORAL
  Filled 2021-11-30: qty 1

## 2021-11-30 MED ORDER — ASPIRIN 81 MG PO CHEW
324.0000 mg | CHEWABLE_TABLET | Freq: Once | ORAL | Status: AC
  Administered 2021-11-30: 324 mg via ORAL
  Filled 2021-11-30: qty 4

## 2021-11-30 MED ORDER — ONDANSETRON HCL 4 MG/2ML IJ SOLN: 4.0000 mg | Freq: Four times a day (QID) | INTRAMUSCULAR | Status: DC | PRN

## 2021-11-30 MED ORDER — HEPARIN BOLUS VIA INFUSION
2000.0000 [IU] | Freq: Once | INTRAVENOUS | Status: AC
Start: 2021-11-30 — End: 2021-11-30
  Administered 2021-11-30: 2000 [IU] via INTRAVENOUS
  Filled 2021-11-30: qty 2000

## 2021-11-30 MED ORDER — HEPARIN BOLUS VIA INFUSION
3500.0000 [IU] | Freq: Once | INTRAVENOUS | Status: AC
  Administered 2021-11-30: 3500 [IU] via INTRAVENOUS
  Filled 2021-11-30: qty 3500

## 2021-11-30 MED ORDER — HEPARIN BOLUS VIA INFUSION
4000.0000 [IU] | Freq: Once | INTRAVENOUS | Status: AC
  Administered 2021-11-30: 4000 [IU] via INTRAVENOUS
  Filled 2021-11-30: qty 4000

## 2021-11-30 MED ORDER — HEPARIN (PORCINE) 25000 UT/250ML-% IV SOLN
2100.0000 [IU]/h | INTRAVENOUS | Status: DC
  Administered 2021-11-30: 1500 [IU]/h via INTRAVENOUS
  Administered 2021-11-30: 1900 [IU]/h via INTRAVENOUS
  Filled 2021-11-30 (×3): qty 250

## 2021-11-30 MED ORDER — ACETAMINOPHEN 325 MG PO TABS: 650.0000 mg | ORAL_TABLET | ORAL | Status: DC | PRN

## 2021-11-30 MED ORDER — ATORVASTATIN CALCIUM 20 MG PO TABS
20.0000 mg | ORAL_TABLET | Freq: Every day | ORAL | Status: DC
  Administered 2021-11-30 – 2021-12-01 (×2): 20 mg via ORAL
  Filled 2021-11-30 (×2): qty 1

## 2021-11-30 MED ORDER — METOPROLOL TARTRATE 25 MG PO TABS
12.5000 mg | ORAL_TABLET | Freq: Two times a day (BID) | ORAL | Status: DC
  Administered 2021-11-30 – 2021-12-01 (×2): 12.5 mg via ORAL
  Filled 2021-11-30 (×3): qty 1

## 2021-11-30 NOTE — Progress Notes (Signed)
ANTICOAGULATION CONSULT NOTE  Pharmacy Consult for heparin infusion Indication: ACS/STEMI  Allergies  Allergen Reactions   Codeine     hallucinations   Penicillins     "almost killed me"   Solanum Lycopersicum [Tomato] Other (See Comments)    Blisters in the mouth    Patient Measurements: Height: 6\' 3"  (190.5 cm) Weight: (!) 141.5 kg (312 lb) IBW/kg (Calculated) : 84.5 Heparin Dosing Weight: 116.4 kg  Vital Signs: Temp: 98.4 F (36.9 C) (01/10 1105) Temp Source: Oral (01/10 1105) BP: 122/75 (01/10 1105) Pulse Rate: 92 (01/10 1105)  Labs: Recent Labs    11/29/21 1342 11/29/21 1632 11/30/21 0626 11/30/21 1138  HGB 15.7  --   --   --   HCT 48.3  --   --   --   PLT 340  --   --   --   APTT  --   --  35  --   LABPROT  --   --  13.7  --   INR  --   --  1.1  --   HEPARINUNFRC  --   --   --  0.19*  CREATININE 1.22  --   --   --   TROPONINIHS 13 15  --   --      Estimated Creatinine Clearance: 122.2 mL/min (by C-G formula based on SCr of 1.22 mg/dL).   Medical History: Past Medical History:  Diagnosis Date   Abscess of neck    Psoriasis    Smoker    Assessment: Pt is 41 yo male presenting to ED with h/o 3-day exertional CP. Pharmacy has been consulted for heparin dosing for ACS.  H/H and plts WNL  Date Time HL Rate/comment 1/10 1138 0.19 1500 un/hr   Goal of Therapy:  Heparin level 0.3-0.7 units/ml Monitor platelets by anticoagulation protocol: Yes   Plan:  Heparin level subtherapeutic Bolus 3500 units x 1 and increase heparin infusion to 1900 units/hr Will check HL 6 hr after rate change Monitor daily CBC, s/s of bleed   41, PharmD  11/30/2021 12:17 PM

## 2021-11-30 NOTE — Consult Note (Signed)
Brandon Grey. is a 41 y.o. male  [id number]  Primary Cardiologist: Adrian Blackwater, MD Reason for Consultation: chest pain  HPI: Brandon Hebert. is a 41 y.o. male with medical history significant for HTN, HLD, prediabetes, class III obesity, nicotine dependence, psoriasis and gout who presents to the ED with a 3-day history of exertional chest pain.  Patient reports chest tightness/heaviness when walking or any exertion with pain radiating to left arm, relieved resolved with rest.  Denies associated nausea, vomiting, diaphoresis.  He does have associated shortness of breath.  Denies lower extremity swelling or leg pain.  Had a stress test 06/09/2020 which was negative.  Denies cough, fever or chills.   Review of Systems: Patient reports having centralized sharp chest pain yesterday while at work, radiating down left arm. Shortness of breath with exertion. Patient now comfortably laying in bed, denies current chest pain.    Past Medical History:  Diagnosis Date   Abscess of neck    Psoriasis    Smoker     (Not in a hospital admission)     [START ON 12/01/2021] aspirin EC  81 mg Oral Daily   atorvastatin  20 mg Oral Daily   metoprolol tartrate  12.5 mg Oral BID    Infusions:  heparin 1,500 Units/hr (11/30/21 0545)    Allergies  Allergen Reactions   Codeine     hallucinations   Penicillins     "almost killed me"   Solanum Lycopersicum [Tomato] Other (See Comments)    Blisters in the mouth    Social History   Socioeconomic History   Marital status: Single    Spouse name: Not on file   Number of children: Not on file   Years of education: Not on file   Highest education level: Not on file  Occupational History   Not on file  Tobacco Use   Smoking status: Every Day    Packs/day: 0.20    Years: 20.00    Pack years: 4.00    Types: Cigarettes   Smokeless tobacco: Current    Types: Snuff  Vaping Use   Vaping Use: Never used  Substance and Sexual Activity    Alcohol use: Yes    Comment: social   Drug use: No   Sexual activity: Not on file  Other Topics Concern   Not on file  Social History Narrative   Not on file   Social Determinants of Health   Financial Resource Strain: Not on file  Food Insecurity: Not on file  Transportation Needs: Not on file  Physical Activity: Not on file  Stress: Not on file  Social Connections: Not on file  Intimate Partner Violence: Not on file    Family History  Problem Relation Age of Onset   Hypertension Mother    Heart attack Father 31   Cancer Neg Hx     PHYSICAL EXAM: Vitals:   11/30/21 0430 11/30/21 0615  BP: 116/76 127/69  Pulse: 62 74  Resp: 16 18  Temp:    SpO2: 95% 98%    No intake or output data in the 24 hours ending 11/30/21 0832  General:  Well appearing. No respiratory difficulty HEENT: normal Neck: supple. no JVD. Carotids 2+ bilat; no bruits. No lymphadenopathy or thryomegaly appreciated. Cor: PMI nondisplaced. Regular rate & rhythm. No rubs, gallops or murmurs. Lungs: clear Abdomen: soft, nontender, nondistended. No hepatosplenomegaly. No bruits or masses. Good bowel sounds. Extremities: no cyanosis, clubbing, rash, edema Neuro: alert &  oriented x 3, cranial nerves grossly intact. moves all 4 extremities w/o difficulty. Affect pleasant.  ECG: normal sinus rhythm, HR 82 bpm, incomplete RBBB, T waver inversion  Results for orders placed or performed during the hospital encounter of 11/30/21 (from the past 24 hour(s))  Basic metabolic panel     Status: None   Collection Time: 11/29/21  1:42 PM  Result Value Ref Range   Sodium 138 135 - 145 mmol/L   Potassium 3.9 3.5 - 5.1 mmol/L   Chloride 103 98 - 111 mmol/L   CO2 28 22 - 32 mmol/L   Glucose, Bld 84 70 - 99 mg/dL   BUN 15 6 - 20 mg/dL   Creatinine, Ser 1.611.22 0.61 - 1.24 mg/dL   Calcium 9.2 8.9 - 09.610.3 mg/dL   GFR, Estimated >04>60 >54>60 mL/min   Anion gap 7 5 - 15  CBC     Status: Abnormal   Collection Time: 11/29/21   1:42 PM  Result Value Ref Range   WBC 13.6 (H) 4.0 - 10.5 K/uL   RBC 5.64 4.22 - 5.81 MIL/uL   Hemoglobin 15.7 13.0 - 17.0 g/dL   HCT 09.848.3 11.939.0 - 14.752.0 %   MCV 85.6 80.0 - 100.0 fL   MCH 27.8 26.0 - 34.0 pg   MCHC 32.5 30.0 - 36.0 g/dL   RDW 82.913.2 56.211.5 - 13.015.5 %   Platelets 340 150 - 400 K/uL   nRBC 0.0 0.0 - 0.2 %  Troponin I (High Sensitivity)     Status: None   Collection Time: 11/29/21  1:42 PM  Result Value Ref Range   Troponin I (High Sensitivity) 13 <18 ng/L  Troponin I (High Sensitivity)     Status: None   Collection Time: 11/29/21  4:32 PM  Result Value Ref Range   Troponin I (High Sensitivity) 15 <18 ng/L  Resp Panel by RT-PCR (Flu A&B, Covid) Nasopharyngeal Swab     Status: None   Collection Time: 11/30/21  2:25 AM   Specimen: Nasopharyngeal Swab; Nasopharyngeal(NP) swabs in vial transport medium  Result Value Ref Range   SARS Coronavirus 2 by RT PCR NEGATIVE NEGATIVE   Influenza A by PCR NEGATIVE NEGATIVE   Influenza B by PCR NEGATIVE NEGATIVE  D-dimer, quantitative     Status: None   Collection Time: 11/30/21  2:25 AM  Result Value Ref Range   D-Dimer, Quant 0.38 0.00 - 0.50 ug/mL-FEU  Lipid panel     Status: Abnormal   Collection Time: 11/30/21  6:26 AM  Result Value Ref Range   Cholesterol 209 (H) 0 - 200 mg/dL   Triglycerides 865222 (H) <150 mg/dL   HDL 23 (L) >78>40 mg/dL   Total CHOL/HDL Ratio 9.1 RATIO   VLDL 44 (H) 0 - 40 mg/dL   LDL Cholesterol 469142 (H) 0 - 99 mg/dL  APTT     Status: None   Collection Time: 11/30/21  6:26 AM  Result Value Ref Range   aPTT 35 24 - 36 seconds  Protime-INR     Status: None   Collection Time: 11/30/21  6:26 AM  Result Value Ref Range   Prothrombin Time 13.7 11.4 - 15.2 seconds   INR 1.1 0.8 - 1.2   DG Chest 2 View  Result Date: 11/29/2021 CLINICAL DATA:  Chest pain EXAM: CHEST - 2 VIEW COMPARISON:  09/26/2020 FINDINGS: No consolidation or edema. No pleural effusion or pneumothorax. Top-normal heart size. No acute osseous  abnormality. IMPRESSION: No acute process in the  chest. Electronically Signed   By: Guadlupe Spanish M.D.   On: 11/29/2021 14:18     ASSESSMENT AND PLAN: Patent resting comfortably in bed. Denies current chest pain. Troponin levels mildly elevated. Patient can be discharged with follow up in office tomorrow 12/01/21 at 9:30 am. Discharge patient on isosorbide 30 mg daily, Plavix 75 mg daily, and aspirin 81 mg daily.  Museum/gallery conservator FNP-C

## 2021-11-30 NOTE — Progress Notes (Addendum)
ANTICOAGULATION CONSULT NOTE  Pharmacy Consult for heparin infusion Indication: ACS/STEMI  Allergies  Allergen Reactions   Codeine     hallucinations   Penicillins     "almost killed me"   Solanum Lycopersicum [Tomato] Other (See Comments)    Blisters in the mouth    Patient Measurements: Height: 6\' 3"  (190.5 cm) Weight: (!) 141.5 kg (312 lb) IBW/kg (Calculated) : 84.5 Heparin Dosing Weight: 116.4 kg  Vital Signs: Temp: 98.2 F (36.8 C) (01/10 1600) Temp Source: Oral (01/10 1600) BP: 120/64 (01/10 2000) Pulse Rate: 75 (01/10 2000)  Labs: Recent Labs    11/29/21 1342 11/29/21 1632 11/30/21 0626 11/30/21 1138 11/30/21 1523 11/30/21 1704 11/30/21 1934  HGB 15.7  --   --   --   --   --  14.9  HCT 48.3  --   --   --   --   --  44.3  PLT 340  --   --   --   --   --  279  APTT  --   --  35  --   --   --   --   LABPROT  --   --  13.7  --   --   --   --   INR  --   --  1.1  --   --   --   --   HEPARINUNFRC  --   --   --  0.19*  --   --  0.23*  CREATININE 1.22  --   --   --   --   --   --   TROPONINIHS 13   < >  --  9 8 9 8    < > = values in this interval not displayed.     Estimated Creatinine Clearance: 122.2 mL/min (by C-G formula based on SCr of 1.22 mg/dL).   Medical History: Past Medical History:  Diagnosis Date   Abscess of neck    Psoriasis    Smoker    Assessment: Pt is 41 yo male presenting to ED with h/o 3-day exertional CP. Pharmacy has been consulted for heparin dosing for ACS.  H/H and plts WNL  Date Time HL Rate/comment 1/10 1138 0.19 1500 un/hr 1/10 1934 0.23 1900 un/hr   Goal of Therapy:  Heparin level 0.3-0.7 units/ml Monitor platelets by anticoagulation protocol: Yes   Plan:  Heparin level is subtherapeutic.Will give heparin bolus of 2000 units/hr and increase heparin infusion to 2100 units/hr. Recheck heparin level in 6 hours. CBC daily while on heparin.    , PharmD  11/30/2021 8:33 PM

## 2021-11-30 NOTE — H&P (Addendum)
History and Physical    Brandon Hebert. DXI:[id number] DOB: 10/22/1981 DOA: 11/30/2021  PCP: [facility]   Patient coming from: home  I have personally briefly reviewed patient's relevant medical records in [facility]  Chief Complaint: chest pain  HPI: Brandon Hebert. is a 41 y.o. male with medical history significant for HTN, HLD, prediabetes, class III obesity, nicotine dependence, psoriasis and gout who presents to the ED with a 3-day history of exertional chest pain.  Patient reports chest tightness/heaviness when walking or any exertion with pain radiating to left arm, relieved resolved with rest.  Denies associated nausea, vomiting, diaphoresis.  He does have associated shortness of breath.  Denies lower extremity swelling or leg pain.  Had a stress test 06/09/2020 which was negative.  Denies cough, fever or chills  ED course: BP 147/86 with otherwise normal vitals Blood work: Troponin 15-15 with D-dimer 0.38 WBC 13.6 COVID and flu negative  EKG, personally viewed and interpreted: NSR at 82 with T wave inversions inferior leads new compared to prior  Chest x-ray with no acute findings  Patient given a chewable aspirin, hospitalist consulted for admission.   Review of Systems: As per HPI otherwise all other systems on review of systems negative.   Assessment/Plan    Unstable angina (HCC)   Family history of CAD -We will start heparin infusion due to high risk chest pain  - Aspirin, atorvastatin and metoprolol    Tobacco use disorder - Nicotine patch if agreeable - Tobacco cessation    HLD (hyperlipidemia) - Atorvastatin    HTN (hypertension) - Atorvastatin     DVT prophylaxis: Heparin infusion  code Status: full code  Family Communication:  none  Disposition Plan: Back to previous home environment Consults called: cardiology  Status:observation    Physical Exam: Vitals:   11/29/21 1335 11/29/21 1348  BP: (!) 147/86   Pulse: 80    Resp: 18   Temp: 98.8 F (37.1 C)   TempSrc: Oral   SpO2: 94%   Weight:  (!) 141.5 kg  Height:  6\' 3"  (1.905 m)   Constitutional: Alert, oriented x 3 . Not in any apparent distress HEENT:      Head: Normocephalic and atraumatic.         Eyes: PERLA, EOMI, Conjunctivae are normal. Sclera is non-icteric.       Mouth/Throat: Mucous membranes are moist.       Neck: Supple with no signs of meningismus. Cardiovascular: Regular rate and rhythm. No murmurs, gallops, or rubs. 2+ symmetrical distal pulses are present . No JVD. No  LE edema Respiratory: Respiratory effort normal .Lungs sounds clear bilaterally. No wheezes, crackles, or rhonchi.  Gastrointestinal: Soft, non tender, non distended. Positive bowel sounds.  Genitourinary: No CVA tenderness. Musculoskeletal: Nontender with normal range of motion in all extremities. No cyanosis, or erythema of extremities. Neurologic:  Face is symmetric. Moving all extremities. No gross focal neurologic deficits . Skin: Skin is warm, dry.  No rash or ulcers Psychiatric: Mood and affect are appropriate     Past Medical History:  Diagnosis Date   Abscess of neck    Psoriasis    Smoker     Past Surgical History:  Procedure Laterality Date   APPENDECTOMY       reports that he has been smoking cigarettes. He has a 4.00 pack-year smoking history. His smokeless tobacco use includes snuff. He reports current alcohol use. He reports that he does not use drugs.  Allergies  Allergen Reactions   Codeine     hallucinations   Penicillins     "almost killed me"    Family History  Problem Relation Age of Onset   Hypertension Mother    Heart attack Father 22   Cancer Neg Hx       Prior to Admission medications   Medication Sig Start Date End Date Taking? Authorizing Provider  colchicine 0.6 MG tablet 2 tabs po x 1, then one tab po 1 hour later 10/06/20   Domenick Gong, MD  naproxen (NAPROSYN) 500 MG tablet Take 1 tablet (500 mg total) by  mouth 2 (two) times daily. 10/06/20   Domenick Gong, MD      Labs on Admission: I have personally reviewed following labs and imaging studies  CBC: Recent Labs  Lab 11/29/21 1342  WBC 13.6*  HGB 15.7  HCT 48.3  MCV 85.6  PLT 340   Basic Metabolic Panel: Recent Labs  Lab 11/29/21 1342  NA 138  K 3.9  CL 103  CO2 28  GLUCOSE 84  BUN 15  CREATININE 1.22  CALCIUM 9.2   GFR: Estimated Creatinine Clearance: 122.2 mL/min (by C-G formula based on SCr of 1.22 mg/dL). Liver Function Tests: No results for input(s): AST, ALT, ALKPHOS, BILITOT, PROT, ALBUMIN in the last 168 hours. No results for input(s): LIPASE, AMYLASE in the last 168 hours. No results for input(s): AMMONIA in the last 168 hours. Coagulation Profile: No results for input(s): INR, PROTIME in the last 168 hours. Cardiac Enzymes: No results for input(s): CKTOTAL, CKMB, CKMBINDEX, TROPONINI in the last 168 hours. BNP (last 3 results) No results for input(s): PROBNP in the last 8760 hours. HbA1C: No results for input(s): HGBA1C in the last 72 hours. CBG: No results for input(s): GLUCAP in the last 168 hours. Lipid Profile: No results for input(s): CHOL, HDL, LDLCALC, TRIG, CHOLHDL, LDLDIRECT in the last 72 hours. Thyroid Function Tests: No results for input(s): TSH, T4TOTAL, FREET4, T3FREE, THYROIDAB in the last 72 hours. Anemia Panel: No results for input(s): VITAMINB12, FOLATE, FERRITIN, TIBC, IRON, RETICCTPCT in the last 72 hours. Urine analysis:    Component Value Date/Time   COLORURINE YELLOW (A) 03/30/2016 0640   APPEARANCEUR CLEAR (A) 03/30/2016 0640   LABSPEC 1.017 03/30/2016 0640   PHURINE 5.0 03/30/2016 0640   GLUCOSEU NEGATIVE 03/30/2016 0640   HGBUR NEGATIVE 03/30/2016 0640   BILIRUBINUR NEGATIVE 03/30/2016 0640   KETONESUR NEGATIVE 03/30/2016 0640   PROTEINUR NEGATIVE 03/30/2016 0640   NITRITE NEGATIVE 03/30/2016 0640   LEUKOCYTESUR NEGATIVE 03/30/2016 0640    Radiological Exams on  Admission: DG Chest 2 View  Result Date: 11/29/2021 CLINICAL DATA:  Chest pain EXAM: CHEST - 2 VIEW COMPARISON:  09/26/2020 FINDINGS: No consolidation or edema. No pleural effusion or pneumothorax. Top-normal heart size. No acute osseous abnormality. IMPRESSION: No acute process in the chest. Electronically Signed   By: Guadlupe Spanish M.D.   On: 11/29/2021 14:18       Andris Baumann MD [facility]   11/30/2021, 3:39 AM

## 2021-11-30 NOTE — ED Provider Notes (Signed)
[facility] Provider Note    Event Date/Time   First MD Initiated Contact with Patient 11/30/21 [phone]     (approximate)   History   Chest Pain   HPI  Brandon Hebert. is a 41 y.o. male with a history of hypertension, obesity, smoking, prediabetes, HLD who presents for evaluation of CP. Patient reports CP for 3 days now.  Pain happens when he walks or exerts himself.  He describes severe pressure in the center of his chest and shortness of breath.  Has been unable to walk for more than a few steps without feeling like that.  When he sits down the pain goes away.  The pain is associated with numbness of his left arm.  Patient has strong family history of heart disease.  Reports having a stress test in 2021 which was negative but never had a catheterization.  He is pain-free at this time.  Denies any prior history.  DVT, recent travel immobilization, leg pain or swelling, hemoptysis, or exogenous hormones.     Past Medical History:  Diagnosis Date   Abscess of neck    Psoriasis    Smoker     Past Surgical History:  Procedure Laterality Date   APPENDECTOMY       Physical Exam   Triage Vital Signs: ED Triage Vitals  Enc Vitals Group     BP 11/29/21 1335 (!) 147/86     Pulse Rate 11/29/21 1335 80     Resp 11/29/21 1335 18     Temp 11/29/21 1335 98.8 F (37.1 C)     Temp Source 11/29/21 1335 Oral     SpO2 11/29/21 1335 94 %     Weight 11/29/21 1348 (!) 312 lb (141.5 kg)     Height 11/29/21 1348 6\' 3"  (1.905 m)     Head Circumference --      Peak Flow --      Pain Score 11/29/21 1347 7     Pain Loc --      Pain Edu? --      Excl. in [place]? --     Most recent vital signs: Vitals:   11/29/21 1335  BP: (!) 147/86  Pulse: 80  Resp: 18  Temp: 98.8 F (37.1 C)  SpO2: 94%     Constitutional: Alert and oriented. Well appearing and in no apparent distress. HEENT:      Head: Normocephalic and atraumatic.         Eyes: Conjunctivae are normal.  Sclera is non-icteric.       Mouth/Throat: Mucous membranes are moist.       Neck: Supple with no signs of meningismus. Cardiovascular: Regular rate and rhythm. No murmurs, gallops, or rubs. 2+ symmetrical distal pulses are present in all extremities.  Respiratory: Normal respiratory effort. Lungs are clear to auscultation bilaterally.  Gastrointestinal: Soft, non tender, and non distended with positive bowel sounds. No rebound or guarding. Genitourinary: No CVA tenderness. Musculoskeletal:  No edema, cyanosis, or erythema of extremities. Neurologic: Normal speech and language. Face is symmetric. Moving all extremities. No gross focal neurologic deficits are appreciated. Skin: Skin is warm, dry and intact. No rash noted. Psychiatric: Mood and affect are normal. Speech and behavior are normal.  ED Results / Procedures / Treatments   Labs (all labs ordered are listed, but only abnormal results are displayed) Labs Reviewed  CBC - Abnormal; Notable for the following components:      Result Value   WBC  13.6 (*)    All other components within normal limits  RESP PANEL BY RT-PCR (FLU A&B, COVID) ARPGX2  BASIC METABOLIC PANEL  D-DIMER, QUANTITATIVE  TROPONIN I (HIGH SENSITIVITY)  TROPONIN I (HIGH SENSITIVITY)     EKG  ED ECG REPORT I, Rudene Re, the attending physician, personally viewed and interpreted this ECG.  Sinus rhythm with a rate of 82, T wave inversions in inferior leads with no ST elevations.  These are new when compared to prior.   RADIOLOGY I have personally reviewed the images performed during this visit and interpret them as below:  No edema or PNA   ___________________________________________________ Interpretation by Radiologist:  DG Chest 2 View  Result Date: 11/29/2021 CLINICAL DATA:  Chest pain EXAM: CHEST - 2 VIEW COMPARISON:  09/26/2020 FINDINGS: No consolidation or edema. No pleural effusion or pneumothorax. Top-normal heart size. No acute osseous  abnormality. IMPRESSION: No acute process in the chest. Electronically Signed   By: Macy Mis M.D.   On: 11/29/2021 14:18      PROCEDURES:  Critical Care performed: No  Procedures    IMPRESSION / MDM / ASSESSMENT AND PLAN / ED COURSE  I reviewed the triage vital signs and the nursing notes.  41 y.o. male with a history of hypertension, obesity, smoking, prediabetes, HLD who presents for evaluation of CP.  Patient with 3 days of chest pain, shortness of breath, and left arm numbness with exertion that resolves at rest.  On exam is well-appearing in no distress with normal vital signs, normal pulses in all 4 extremities, no asymmetric leg swelling, lungs are clear to auscultation, heart regular rate and rhythm with no murmurs.  Ddx: Unstable angina versus NSTEMI versus PE versus CHF versus pericardial effusion   Plan: EKG, chest x-ray, CBC, BMP, troponin x2.  Patient placed on telemetry for close monitoring of cardiorespiratory status.  Aspirin given.   MEDICATIONS GIVEN IN ED: Medications  aspirin chewable tablet 324 mg (324 mg Oral Given 11/30/21 0222)     ED COURSE: EKG showing new T wave inversions in inferior leads.  2 high-sensitivity troponins are negative.  Patient's pain is only with ambulation concerning for unstable angina at this time since symptoms are getting progressively worse.  There is no signs of STEMI.  Chest x-ray with no acute process.  Patient given a full dose of aspirin.  I consulted the hospitalist service and we discussed the case and patient was accepted to their service for admission.   Consults: Hospitalist   EMR reviewed including last visit to primary care doctor in November 2022 for hypertension, hyperlipidemia, prediabetes, and smoking.  Unfortunately the stress test done in 2021 is not available for review.         FINAL CLINICAL IMPRESSION(S) / ED DIAGNOSES   Final diagnoses:  Unstable angina ([place])     Rx / DC Orders   ED  Discharge Orders     None        Note:  This document was prepared using Dragon voice recognition software and may include unintentional dictation errors.    Rudene Re, MD 11/30/21 [phone]

## 2021-11-30 NOTE — ED Notes (Signed)
Awake, resting on cart. Rates L sided chest pain 3/10. Stated exertional L sided chest pain since Saturday. While at work today became SOB with Pain and numbness to the L arm. Significant other by the bed. Dr. Para March at bedside. Plan to admit to hospital.

## 2021-11-30 NOTE — Progress Notes (Signed)
ANTICOAGULATION CONSULT NOTE  Pharmacy Consult for heparin infusion Indication: ACS/STEMI  Allergies  Allergen Reactions   Codeine     hallucinations   Penicillins     "almost killed me"    Patient Measurements: Height: 6\' 3"  (190.5 cm) Weight: (!) 141.5 kg (312 lb) IBW/kg (Calculated) : 84.5 Heparin Dosing Weight: 116.4 kg  Vital Signs:    Labs: Recent Labs    11/29/21 1342 11/29/21 1632  HGB 15.7  --   HCT 48.3  --   PLT 340  --   CREATININE 1.22  --   TROPONINIHS 13 15    Estimated Creatinine Clearance: 122.2 mL/min (by C-G formula based on SCr of 1.22 mg/dL).   Medical History: Past Medical History:  Diagnosis Date   Abscess of neck    Psoriasis    Smoker    Assessment: Pt is 41 yo male presenting to ED with h/o 3-day exertional CP.    Goal of Therapy:  INR 2-3 Monitor platelets by anticoagulation protocol: Yes   Plan:  Bolus 4000 units x 1 Start heparin infusion at 1500 units/hr Will check HL in 6 hr after start of infusion CBC daily while on heparin.  41, PharmD, [facility] 11/30/2021 4:09 AM

## 2021-11-30 NOTE — Progress Notes (Signed)
Patient seen and examined.  Admitted early morning hours by nighttime hospitalist. In brief, 41 year old male with history of hypertension, hyperlipidemia, obesity, smoker presented with 3 days of exertional chest pain.  Reported chest heaviness on walking radiating to the left arm.  EKG and troponins are nonischemic.  D-dimer 0.38.  COVID and flu negative.  Because of typical nature of chest pain, patient was admitted started on aspirin, atorvastatin and metoprolol along with heparin infusion and admitted to hospital with cardiology consultation.  Seen and examined.  At rest no complaints.  Patient mobilized and complained of recurrent moderate pain improved with nitro.  Repeat troponins remain nonischemic.  Plan: Seen by cardiology.  They recommended discharge home with dual antiplatelet therapy with outpatient office follow-up. I have communicated with cardiology that if patient is having ongoing pain, patient will need ischemic evaluation before discharge.  We will keep patient on heparin infusion.  Continue to monitor.  Same-day admission.  No charge visit.

## 2021-12-01 ENCOUNTER — Observation Stay
Admit: 2021-12-01 | Discharge: 2021-12-01 | Disposition: A | Discharge status: Home / Self Care | Payer: 59 | Attending: Internal Medicine | Admitting: Internal Medicine

## 2021-12-01 DIAGNOSIS — I10 Essential (primary) hypertension: Secondary | ICD-10-CM

## 2021-12-01 DIAGNOSIS — I200 Unstable angina: Secondary | ICD-10-CM | POA: Diagnosis not present

## 2021-12-01 LAB — HEPARIN LEVEL (UNFRACTIONATED)
Heparin Unfractionated: 0.6 IU/mL (ref 0.30–0.70)
Heparin Unfractionated: 0.49 IU/mL (ref 0.30–0.70)

## 2021-12-01 LAB — CBC
HCT: 43.3 % (ref 39.0–52.0)
Hemoglobin: 14.4 g/dL (ref 13.0–17.0)
MCH: 27.9 pg (ref 26.0–34.0)
MCHC: 33.3 g/dL (ref 30.0–36.0)
MCV: 83.9 fL (ref 80.0–100.0)
Platelets: 271 10*3/uL (ref 150–400)
RBC: 5.16 MIL/uL (ref 4.22–5.81)
RDW: 13.2 % (ref 11.5–15.5)
WBC: 12.4 10*3/uL — ABNORMAL HIGH (ref 4.0–10.5)
nRBC: 0 % (ref 0.0–0.2)

## 2021-12-01 LAB — ECHOCARDIOGRAM COMPLETE
AR max vel: 4.57 cm2
AV Area VTI: 3.95 cm2
AV Area mean vel: 4.2 cm2
AV Mean grad: 4 mmHg
AV Peak grad: 6.8 mmHg
Ao pk vel: 1.3 m/s
Area-P 1/2: 3.1 cm2
MV VTI: 3.39 cm2
S' Lateral: 2.62 cm

## 2021-12-01 MED ORDER — ATORVASTATIN CALCIUM 20 MG PO TABS: 20.0000 mg | ORAL_TABLET | Freq: Every day | ORAL | 0 refills | Status: DC

## 2021-12-01 MED ORDER — METOPROLOL SUCCINATE ER 50 MG PO TB24
50.0000 mg | ORAL_TABLET | Freq: Every day | ORAL | 0 refills | Status: DC
Start: 2021-12-01 — End: 2023-02-02

## 2021-12-01 MED ORDER — ASPIRIN 81 MG PO TBEC: 81.0000 mg | DELAYED_RELEASE_TABLET | Freq: Every day | ORAL | 0 refills | Status: AC

## 2021-12-01 MED ORDER — PERFLUTREN LIPID MICROSPHERE
1.0000 mL | INTRAVENOUS | Status: AC | PRN
  Administered 2021-12-01: 3 mL via INTRAVENOUS
  Filled 2021-12-01: qty 10

## 2021-12-01 NOTE — Discharge Summary (Signed)
Physician Discharge Summary  Brandon Hebert. FUX:[id number] DOB: 15-Apr-1981 DOA: 11/30/2021  PCP: [facility]  Admit date: 11/30/2021 Discharge date: 12/01/2021  Admitted From: home  Disposition:  home   Recommendations for Outpatient Follow-up:  Follow up with PCP in 1-2 weeks F/u w/ cardio, Dr. Welton Flakes, on 12/03/21 at 9:30AM  Home Health: no  Equipment/Devices  Discharge Condition: stable  CODE STATUS: full  Diet recommendation: Heart Healthy   Brief/Interim Summary: HPI was taken from Dr. Para March: Brandon Hebert. is a 41 y.o. male with medical history significant for HTN, HLD, prediabetes, class III obesity, nicotine dependence, psoriasis and gout who presents to the ED with a 3-day history of exertional chest pain.  Patient reports chest tightness/heaviness when walking or any exertion with pain radiating to left arm, relieved resolved with rest.  Denies associated nausea, vomiting, diaphoresis.  He does have associated shortness of breath.  Denies lower extremity swelling or leg pain.  Had a stress test 06/09/2020 which was negative.  Denies cough, fever or chills   ED course: BP 147/86 with otherwise normal vitals Blood work: Troponin 15-15 with D-dimer 0.38 WBC 13.6 COVID and flu negative   EKG, personally viewed and interpreted: NSR at 82 with T wave inversions inferior leads new compared to prior   Chest x-ray with no acute findings   Patient given a chewable aspirin, hospitalist consulted for admission.    As per Dr. Mayford Knife 12/01/21: Pt presented w/ exertional chest pain. Pt was treated w/ aspirin, IV heparin drip & metoprolol. Troponins were neg x 3. Echo showed EF 70-75%, no regional wall motion abnormalities, normal diastolic function. No further inpatient cardiac work-up was indicated as per cardio. Pt will f/u w/ cardio, Dr. Welton Flakes on 12/03/21 at 9:30AM.    Discharge Diagnoses:  Principal Problem:   Unstable angina (HCC) Active Problems:   Tobacco use  disorder   HLD (hyperlipidemia)   HTN (hypertension)   Family history of early CAD  Unstable angina: IV heparin drip was d/c. Troponins neg x 3. Continue on aspirin, statin & metoprolol. Fam hx of CAD    Tobacco use disorder: nicotine patch to prevent w/drawl. Smoking cessation counseling    HLD: continue on statin    HTN: continue on metoprolol   Morbid obesity: BMI 39.0. Complicates overall care & prognosis   Discharge Instructions  Discharge Instructions     Diet - low sodium heart healthy   Complete by: As directed    Discharge instructions   Complete by: As directed    F/u w/ cardio, Dr. Welton Flakes, 12/03/21 at 9:30AM. F/u w/ PCP in 1-2 weeks   Increase activity slowly   Complete by: As directed       Allergies as of 12/01/2021       Reactions   Codeine    hallucinations   Penicillins    "almost killed me"   Solanum Lycopersicum [tomato] Other (See Comments)   Blisters in the mouth        Medication List     STOP taking these medications    naproxen 500 MG tablet Commonly known as: NAPROSYN       TAKE these medications    allopurinol 100 MG tablet Commonly known as: ZYLOPRIM Take 1 tablet by mouth daily.   amLODipine 5 MG tablet Commonly known as: NORVASC Take 1 tablet by mouth daily.   aspirin 81 MG EC tablet Take 1 tablet (81 mg total) by mouth daily. Swallow whole. Start taking  on: December 02, 2021   atorvastatin 20 MG tablet Commonly known as: LIPITOR Take 1 tablet (20 mg total) by mouth daily. Start taking on: December 02, 2021   Cholecalciferol 50 MCG (2000 UT) Caps Take 1 capsule by mouth daily.   colchicine 0.6 MG tablet 2 tabs po x 1, then one tab po 1 hour later   cyanocobalamin 1000 MCG tablet Take 1 tablet by mouth daily.   metoprolol succinate 50 MG 24 hr tablet Commonly known as: Toprol XL Take 1 tablet (50 mg total) by mouth daily. Take with or immediately following a meal.        Allergies  Allergen Reactions    Codeine     hallucinations   Penicillins     "almost killed me"   Solanum Lycopersicum [Tomato] Other (See Comments)    Blisters in the mouth    Consultations: Cardio    Procedures/Studies: DG Chest 2 View  Result Date: 11/29/2021 CLINICAL DATA:  Chest pain EXAM: CHEST - 2 VIEW COMPARISON:  09/26/2020 FINDINGS: No consolidation or edema. No pleural effusion or pneumothorax. Top-normal heart size. No acute osseous abnormality. IMPRESSION: No acute process in the chest. Electronically Signed   By: Guadlupe SpanishPraneil  Patel M.D.   On: 11/29/2021 14:18   ECHOCARDIOGRAM COMPLETE  Result Date: 12/01/2021    ECHOCARDIOGRAM REPORT   Patient Name:   Brandon MeyerJerry Lee Rehmann Jr. Date of Exam: 12/01/2021 Medical Rec #:  [id number]          Height:       75.0 in Accession #:    [id number]         Weight:       312.0 lb Date of Birth:  08/12/1981           BSA:          2.651 m Patient Age:    40 years           BP:           101/60 mmHg Patient Gender: M                  HR:           73 bpm. Exam Location:  [facility] Procedure: 2D Echo, Color Doppler, Cardiac Doppler and Intracardiac            Opacification Agent Indications:     R07.9 Chest Pain  History:         Patient has no prior history of Echocardiogram examinations.                  Signs/Symptoms:Shortness of Breath; Risk Factors:Current                  Smoker, Hypertension, Dyslipidemia and Family History of                  Coronary Artery Disease.  Sonographer:     Humphrey RollsJoan Heiss Referring Phys:  [id number] Dorcas CarrowKUBER GHIMIRE Diagnosing Phys: Adrian BlackwaterShaukat Khan  Sonographer Comments: Suboptimal apical window. IMPRESSIONS  1. Left ventricular ejection fraction, by estimation, is 70 to 75%. The left ventricle has hyperdynamic function. The left ventricle has no regional wall motion abnormalities. Left ventricular diastolic parameters were normal.  2. Right ventricular systolic function is normal. The right ventricular size is mildly enlarged.  3. The mitral valve is normal in structure.  Trivial mitral valve regurgitation. No evidence of mitral stenosis.  4. The aortic valve is normal in structure. Aortic valve  regurgitation is not visualized. No aortic stenosis is present.  5. The inferior vena cava is normal in size with greater than 50% respiratory variability, suggesting right atrial pressure of 3 mmHg. FINDINGS  Left Ventricle: Left ventricular ejection fraction, by estimation, is 70 to 75%. The left ventricle has hyperdynamic function. The left ventricle has no regional wall motion abnormalities. Definity contrast agent was given IV to delineate the left ventricular endocardial borders. The left ventricular internal cavity size was normal in size. There is no left ventricular hypertrophy. Left ventricular diastolic parameters were normal. Right Ventricle: The right ventricular size is mildly enlarged. No increase in right ventricular wall thickness. Right ventricular systolic function is normal. Left Atrium: Left atrial size was normal in size. Right Atrium: Right atrial size was normal in size. Pericardium: There is no evidence of pericardial effusion. Mitral Valve: The mitral valve is normal in structure. Trivial mitral valve regurgitation. No evidence of mitral valve stenosis. MV peak gradient, 4.1 mmHg. The mean mitral valve gradient is 1.0 mmHg. Tricuspid Valve: The tricuspid valve is normal in structure. Tricuspid valve regurgitation is trivial. No evidence of tricuspid stenosis. Aortic Valve: The aortic valve is normal in structure. Aortic valve regurgitation is not visualized. No aortic stenosis is present. Aortic valve mean gradient measures 4.0 mmHg. Aortic valve peak gradient measures 6.8 mmHg. Aortic valve area, by VTI measures 3.95 cm. Pulmonic Valve: The pulmonic valve was normal in structure. Pulmonic valve regurgitation is not visualized. No evidence of pulmonic stenosis. Aorta: The aortic root is normal in size and structure. Venous: The inferior vena cava is normal in size  with greater than 50% respiratory variability, suggesting right atrial pressure of 3 mmHg. IAS/Shunts: No atrial level shunt detected by color flow Doppler.  LEFT VENTRICLE PLAX 2D LVIDd:         4.43 cm   Diastology LVIDs:         2.62 cm   LV e' medial:    7.51 cm/s LV PW:         1.32 cm   LV E/e' medial:  10.1 LV IVS:        1.00 cm   LV e' lateral:   11.90 cm/s LVOT diam:     2.60 cm   LV E/e' lateral: 6.4 LV SV:         107 LV SV Index:   40 LVOT Area:     5.31 cm  RIGHT VENTRICLE RV Basal diam:  4.94 cm RV Mid diam:    4.90 cm LEFT ATRIUM             Index LA diam:        3.50 cm 1.32 cm/m LA Vol (A2C):   47.7 ml 17.99 ml/m LA Vol (A4C):   46.2 ml 17.43 ml/m LA Biplane Vol: 47.8 ml 18.03 ml/m  AORTIC VALVE                    PULMONIC VALVE AV Area (Vmax):    4.57 cm     PV Vmax:       0.97 m/s AV Area (Vmean):   4.20 cm     PV Vmean:      69.400 cm/s AV Area (VTI):     3.95 cm     PV VTI:        0.167 m AV Vmax:           130.00 cm/s  PV Peak grad:  3.8 mmHg AV  Vmean:          93.300 cm/s  PV Mean grad:  2.0 mmHg AV VTI:            0.270 m AV Peak Grad:      6.8 mmHg AV Mean Grad:      4.0 mmHg LVOT Vmax:         112.00 cm/s LVOT Vmean:        73.800 cm/s LVOT VTI:          0.201 m LVOT/AV VTI ratio: 0.74  AORTA Ao Root diam: 3.00 cm MITRAL VALVE MV Area (PHT): 3.10 cm    SHUNTS MV Area VTI:   3.39 cm    Systemic VTI:  0.20 m MV Peak grad:  4.1 mmHg    Systemic Diam: 2.60 cm MV Mean grad:  1.0 mmHg MV Vmax:       1.01 m/s MV Vmean:      48.1 cm/s MV Decel Time: 245 msec MV E velocity: 75.80 cm/s MV A velocity: 66.40 cm/s MV E/A ratio:  1.14 Shaukat Khan Electronically signed by Adrian Blackwater Signature Date/Time: 12/01/2021/11:28:30 AM    Final    (Echo, Carotid, EGD, Colonoscopy, ERCP)    Subjective: Pt denies any chest pain or shortness of breath currently   Discharge Exam: Vitals:   12/01/21 0600 12/01/21 0745  BP: 101/60 123/70  Pulse: 69 64  Resp: 15 15  Temp:  (!) 97.5 F (36.4 C)   SpO2: 94% 96%   Vitals:   12/01/21 0400 12/01/21 0500 12/01/21 0600 12/01/21 0745  BP: (!) 141/77 (!) 111/49 101/60 123/70  Pulse: 61 (!) 55 69 64  Resp: 13 18 15 15   Temp:    (!) 97.5 F (36.4 C)  TempSrc:    Oral  SpO2: 90% 93% 94% 96%  Weight:      Height:        General: Pt is alert, awake, not in acute distress Cardiovascular: S1/S2 +, no rubs, no gallops Respiratory: CTA bilaterally, no wheezing, no rhonchi Abdominal: Soft, NT, obese, bowel sounds + Extremities: no edema, no cyanosis    The results of significant diagnostics from this hospitalization (including imaging, microbiology, ancillary and laboratory) are listed below for reference.     Microbiology: Recent Results (from the past 240 hour(s))  Resp Panel by RT-PCR (Flu A&B, Covid) Nasopharyngeal Swab     Status: None   Collection Time: 11/30/21  2:25 AM   Specimen: Nasopharyngeal Swab; Nasopharyngeal(NP) swabs in vial transport medium  Result Value Ref Range Status   SARS Coronavirus 2 by RT PCR NEGATIVE NEGATIVE Final    Comment: (NOTE) SARS-CoV-2 target nucleic acids are NOT DETECTED.  The SARS-CoV-2 RNA is generally detectable in upper respiratory specimens during the acute phase of infection. The lowest concentration of SARS-CoV-2 viral copies this assay can detect is 138 copies/mL. A negative result does not preclude SARS-Cov-2 infection and should not be used as the sole basis for treatment or other patient management decisions. A negative result may occur with  improper specimen collection/handling, submission of specimen other than nasopharyngeal swab, presence of viral mutation(s) within the areas targeted by this assay, and inadequate number of viral copies(<138 copies/mL). A negative result must be combined with clinical observations, patient history, and epidemiological information. The expected result is Negative.  Fact Sheet for Patients:   01/28/22  Fact Sheet for Healthcare Providers:  BloggerCourse.com  This test is no t yet approved or cleared by the [employer]  States FDA and  has been authorized for detection and/or diagnosis of SARS-CoV-2 by FDA under an Emergency Use Authorization (EUA). This EUA will remain  in effect (meaning this test can be used) for the duration of the COVID-19 declaration under Section 564(b)(1) of the Act, 21 U.S.C.section 360bbb-3(b)(1), unless the authorization is terminated  or revoked sooner.       Influenza A by PCR NEGATIVE NEGATIVE Final   Influenza B by PCR NEGATIVE NEGATIVE Final    Comment: (NOTE) The Xpert Xpress SARS-CoV-2/FLU/RSV plus assay is intended as an aid in the diagnosis of influenza from Nasopharyngeal swab specimens and should not be used as a sole basis for treatment. Nasal washings and aspirates are unacceptable for Xpert Xpress SARS-CoV-2/FLU/RSV testing.  Fact Sheet for Patients: BloggerCourse.com  Fact Sheet for Healthcare Providers: SeriousBroker.it  This test is not yet approved or cleared by the [place] FDA and has been authorized for detection and/or diagnosis of SARS-CoV-2 by FDA under an Emergency Use Authorization (EUA). This EUA will remain in effect (meaning this test can be used) for the duration of the COVID-19 declaration under Section 564(b)(1) of the Act, 21 U.S.C. section 360bbb-3(b)(1), unless the authorization is terminated or revoked.  Performed at [facility], [address] [place], [place] [address]      Labs: BNP (last 3 results) No results for input(s): BNP in the last 8760 hours. Basic Metabolic Panel: Recent Labs  Lab 11/29/21 1342  NA 138  K 3.9  CL 103  CO2 28  GLUCOSE 84  BUN 15  CREATININE 1.22  CALCIUM 9.2   Liver Function Tests: No results for input(s): AST, ALT, ALKPHOS, BILITOT,  PROT, ALBUMIN in the last 168 hours. No results for input(s): LIPASE, AMYLASE in the last 168 hours. No results for input(s): AMMONIA in the last 168 hours. CBC: Recent Labs  Lab 11/29/21 1342 11/30/21 1934 12/01/21 0307  WBC 13.6* 11.5* 12.4*  HGB 15.7 14.9 14.4  HCT 48.3 44.3 43.3  MCV 85.6 84.4 83.9  PLT 340 279 271   Cardiac Enzymes: No results for input(s): CKTOTAL, CKMB, CKMBINDEX, TROPONINI in the last 168 hours. BNP: Invalid input(s): POCBNP CBG: No results for input(s): GLUCAP in the last 168 hours. D-Dimer Recent Labs    11/30/21 0225  DDIMER 0.38   Hgb A1c No results for input(s): HGBA1C in the last 72 hours. Lipid Profile Recent Labs    11/30/21 0626  CHOL 209*  HDL 23*  LDLCALC 142*  TRIG 222*  CHOLHDL 9.1   Thyroid function studies No results for input(s): TSH, T4TOTAL, T3FREE, THYROIDAB in the last 72 hours.  Invalid input(s): FREET3 Anemia work up No results for input(s): VITAMINB12, FOLATE, FERRITIN, TIBC, IRON, RETICCTPCT in the last 72 hours. Urinalysis    Component Value Date/Time   COLORURINE YELLOW (A) 03/30/2016 0640   APPEARANCEUR CLEAR (A) 03/30/2016 0640   LABSPEC 1.017 03/30/2016 0640   PHURINE 5.0 03/30/2016 0640   GLUCOSEU NEGATIVE 03/30/2016 0640   HGBUR NEGATIVE 03/30/2016 0640   BILIRUBINUR NEGATIVE 03/30/2016 0640   KETONESUR NEGATIVE 03/30/2016 0640   PROTEINUR NEGATIVE 03/30/2016 0640   NITRITE NEGATIVE 03/30/2016 0640   LEUKOCYTESUR NEGATIVE 03/30/2016 0640   Sepsis Labs Invalid input(s): PROCALCITONIN,  WBC,  LACTICIDVEN Microbiology Recent Results (from the past 240 hour(s))  Resp Panel by RT-PCR (Flu A&B, Covid) Nasopharyngeal Swab     Status: None   Collection Time: 11/30/21  2:25 AM   Specimen: Nasopharyngeal Swab; Nasopharyngeal(NP) swabs in  vial transport medium  Result Value Ref Range Status   SARS Coronavirus 2 by RT PCR NEGATIVE NEGATIVE Final    Comment: (NOTE) SARS-CoV-2 target nucleic acids are NOT  DETECTED.  The SARS-CoV-2 RNA is generally detectable in upper respiratory specimens during the acute phase of infection. The lowest concentration of SARS-CoV-2 viral copies this assay can detect is 138 copies/mL. A negative result does not preclude SARS-Cov-2 infection and should not be used as the sole basis for treatment or other patient management decisions. A negative result may occur with  improper specimen collection/handling, submission of specimen other than nasopharyngeal swab, presence of viral mutation(s) within the areas targeted by this assay, and inadequate number of viral copies(<138 copies/mL). A negative result must be combined with clinical observations, patient history, and epidemiological information. The expected result is Negative.  Fact Sheet for Patients:  BloggerCourse.com  Fact Sheet for Healthcare Providers:  [employer]iousBroker.it  This test is no t yet approved or cleared by the [place] FDA and  has been authorized for detection and/or diagnosis of SARS-CoV-2 by FDA under an Emergency Use Authorization (EUA). This EUA will remain  in effect (meaning this test can be used) for the duration of the COVID-19 declaration under Section 564(b)(1) of the Act, 21 U.S.C.section 360bbb-3(b)(1), unless the authorization is terminated  or revoked sooner.       Influenza A by PCR NEGATIVE NEGATIVE Final   Influenza B by PCR NEGATIVE NEGATIVE Final    Comment: (NOTE) The Xpert Xpress SARS-CoV-2/FLU/RSV plus assay is intended as an aid in the diagnosis of influenza from Nasopharyngeal swab specimens and should not be used as a sole basis for treatment. Nasal washings and aspirates are unacceptable for Xpert Xpress SARS-CoV-2/FLU/RSV testing.  Fact Sheet for Patients: BloggerCourse.com  Fact Sheet for Healthcare Providers: SeriousBroker.it  This test is not yet  approved or cleared by the [place] FDA and has been authorized for detection and/or diagnosis of SARS-CoV-2 by FDA under an Emergency Use Authorization (EUA). This EUA will remain in effect (meaning this test can be used) for the duration of the COVID-19 declaration under Section 564(b)(1) of the Act, 21 U.S.C. section 360bbb-3(b)(1), unless the authorization is terminated or revoked.  Performed at [facility], [address] [place], [place] [address]      Time coordinating discharge: Over 30 minutes  SIGNED:   Charise Killian, MD  [facility] 12/01/2021, 11:40 AM Pager   If 7PM-7AM, please contact night-coverage

## 2021-12-01 NOTE — ED Notes (Signed)
Brandon Hebert. to be D/C'd Home per MD order.  Discussed prescriptions and follow up appointments with the patient.medication list explained in detail. Pt verbalized understanding. Father at bedside to transport pt home.   Vitals:   12/01/21 1000 12/01/21 1100  BP: 118/76 120/80  Pulse: 76 61  Resp: 17 17  Temp:    SpO2: 91% 93%    Skin clean, dry and intact without evidence of skin break down, no evidence of skin tears noted. IV catheter discontinued intact. Site without signs and symptoms of complications. Dressing and pressure applied. Pt denies pain at this time. No complaints noted.  An After Visit Summary was printed and given to the patient. Patient escorted via WC, and D/C home via private auto.  [name]

## 2021-12-01 NOTE — Progress Notes (Signed)
SUBJECTIVE: Brandon Hebert. is a 41 y.o. male with medical history significant for HTN, HLD, prediabetes, class III obesity, nicotine dependence, psoriasis and gout who presented to the ED with a 3-day history of exertional chest pain.  Patient reports chest tightness/heaviness when walking or any exertion with pain radiating to left arm, resolved with rest.  Denies associated nausea, vomiting, diaphoresis.  He does have associated shortness of breath.  Denies lower extremity swelling or leg pain.  Had a stress test 06/09/2020 which was negative.  Denies cough, fever or chills.   Vitals:   12/01/21 0400 12/01/21 0500 12/01/21 0600 12/01/21 0745  BP: (!) 141/77 (!) 111/49 101/60 123/70  Pulse: 61 (!) 55 69 64  Resp: 13 18 15 15   Temp:    (!) 97.5 F (36.4 C)  TempSrc:    Oral  SpO2: 90% 93% 94% 96%  Weight:      Height:       No intake or output data in the 24 hours ending 12/01/21 1028  LABS: Basic Metabolic Panel: Recent Labs    11/29/21 1342  NA 138  K 3.9  CL 103  CO2 28  GLUCOSE 84  BUN 15  CREATININE 1.22  CALCIUM 9.2   Liver Function Tests: No results for input(s): AST, ALT, ALKPHOS, BILITOT, PROT, ALBUMIN in the last 72 hours. No results for input(s): LIPASE, AMYLASE in the last 72 hours. CBC: Recent Labs    11/30/21 1934 12/01/21 0307  WBC 11.5* 12.4*  HGB 14.9 14.4  HCT 44.3 43.3  MCV 84.4 83.9  PLT 279 271   Cardiac Enzymes: No results for input(s): CKTOTAL, CKMB, CKMBINDEX, TROPONINI in the last 72 hours. BNP: Invalid input(s): POCBNP D-Dimer: Recent Labs    11/30/21 0225  DDIMER 0.38   Hemoglobin A1C: No results for input(s): HGBA1C in the last 72 hours. Fasting Lipid Panel: Recent Labs    11/30/21 0626  CHOL 209*  HDL 23*  LDLCALC 142*  TRIG 222*  CHOLHDL 9.1   Thyroid Function Tests: No results for input(s): TSH, T4TOTAL, T3FREE, THYROIDAB in the last 72 hours.  Invalid input(s): FREET3 Anemia Panel: No results for input(s):  VITAMINB12, FOLATE, FERRITIN, TIBC, IRON, RETICCTPCT in the last 72 hours.   PHYSICAL EXAM General: Well developed, well nourished, in no acute distress HEENT:  Normocephalic and atramatic Neck:  No JVD.  Lungs: Clear bilaterally to auscultation and percussion. Heart: HRRR . Normal S1 and S2 without gallops or murmurs.  Abdomen: Bowel sounds are positive, abdomen soft and non-tender  Msk:  Back normal, normal gait. Normal strength and tone for age. Extremities: No clubbing, cyanosis or edema.   Neuro: Alert and oriented X 3. Psych:  Good affect, responds appropriately  TELEMETRY: sinus rhythm, HR 60 bpm  ASSESSMENT AND PLAN: Patient resting comfortably in bed. Denies current chest pain. Repeat troponin levels unremarkable. Echo results pending. Patient may be discharged with follow in office for outpatient testing. Discharge patient on isosorbide 30 mg daily, Plavix 75 mg daily, and aspirin 81 mg daily. Follow up in office Friday, 12/03/20 at 9:30 am.  Principal Problem:   Unstable angina (HCC) Active Problems:   Tobacco use disorder   HLD (hyperlipidemia)   HTN (hypertension)   Family history of early CAD    Dorota Heinrichs, FNP-C 12/01/2021 10:28 AM

## 2021-12-01 NOTE — Progress Notes (Signed)
ANTICOAGULATION CONSULT NOTE  Pharmacy Consult for Heparin Infusion Indication: ACS/STEMI  Patient Measurements: Height: 6\' 3"  (190.5 cm) Weight: (!) 141.5 kg (312 lb) IBW/kg (Calculated) : 84.5 Heparin Dosing Weight: 116.4 kg  Labs: Recent Labs    11/29/21 1342 11/29/21 1632 11/30/21 0626 11/30/21 1138 11/30/21 1523 11/30/21 1704 11/30/21 1934 12/01/21 0307 12/01/21 0948  HGB 15.7  --   --   --   --   --  14.9 14.4  --   HCT 48.3  --   --   --   --   --  44.3 43.3  --   PLT 340  --   --   --   --   --  279 271  --   APTT  --   --  35  --   --   --   --   --   --   LABPROT  --   --  13.7  --   --   --   --   --   --   INR  --   --  1.1  --   --   --   --   --   --   HEPARINUNFRC  --   --   --    < >  --   --  0.23* 0.60 0.49  CREATININE 1.22  --   --   --   --   --   --   --   --   TROPONINIHS 13   < >  --    < > 8 9 8   --   --    < > = values in this interval not displayed.     Estimated Creatinine Clearance: 122.2 mL/min (by C-G formula based on SCr of 1.22 mg/dL).   Medical History: Past Medical History:  Diagnosis Date   Abscess of neck    Psoriasis    Smoker    Assessment: Pt is 41 yo male presenting to ED with h/o 3-day exertional CP. Pharmacy has been consulted for heparin dosing for ACS.  H/H and plts WNL  Date Time HL Rate/comment 1/10 1138 0.19 1500 un/hr 1/10 1934 0.23 1900 un/hr 1/11 0307 0.60 Therapeutic x 1 1/11 0948 0.49 Therapeutic x 2   Goal of Therapy:  Heparin level 0.3-0.7 units/ml Monitor platelets by anticoagulation protocol: Yes   Plan:  --Heparin level is therapeutic x 2 --Continue heparin infusion at 2100 units/hr  --Re-check HL tomorrow AM --Daily CBC per protocol while on IV heparin  Benita Gutter  12/01/2021 10:30 AM

## 2021-12-01 NOTE — Progress Notes (Signed)
*  PRELIMINARY RESULTS* Echocardiogram 2D Echocardiogram has been performed.  Brandon Hebert 12/01/2021, 9:03 AM

## 2021-12-01 NOTE — Progress Notes (Signed)
ANTICOAGULATION CONSULT NOTE  Pharmacy Consult for heparin infusion Indication: ACS/STEMI  Allergies  Allergen Reactions   Codeine     hallucinations   Penicillins     "almost killed me"   Solanum Lycopersicum [Tomato] Other (See Comments)    Blisters in the mouth    Patient Measurements: Height: 6\' 3"  (190.5 cm) Weight: (!) 141.5 kg (312 lb) IBW/kg (Calculated) : 84.5 Heparin Dosing Weight: 116.4 kg  Vital Signs: BP: 141/77 (01/11 0400) Pulse Rate: 61 (01/11 0400)  Labs: Recent Labs    11/29/21 1342 11/29/21 1632 11/30/21 0626 11/30/21 1138 11/30/21 1523 11/30/21 1704 11/30/21 1934 12/01/21 0307  HGB 15.7  --   --   --   --   --  14.9 14.4  HCT 48.3  --   --   --   --   --  44.3 43.3  PLT 340  --   --   --   --   --  279 271  APTT  --   --  35  --   --   --   --   --   LABPROT  --   --  13.7  --   --   --   --   --   INR  --   --  1.1  --   --   --   --   --   HEPARINUNFRC  --   --   --  0.19*  --   --  0.23* 0.60  CREATININE 1.22  --   --   --   --   --   --   --   TROPONINIHS 13   < >  --  9 8 9 8   --    < > = values in this interval not displayed.     Estimated Creatinine Clearance: 122.2 mL/min (by C-G formula based on SCr of 1.22 mg/dL).   Medical History: Past Medical History:  Diagnosis Date   Abscess of neck    Psoriasis    Smoker    Assessment: Pt is 41 yo male presenting to ED with h/o 3-day exertional CP. Pharmacy has been consulted for heparin dosing for ACS.  H/H and plts WNL  Date Time HL Rate/comment 1/10 1138 0.19 1500 un/hr 1/10 1934 0.23 1900 un/hr 1/11 0307 0.60 Therapeutic x 1   Goal of Therapy:  Heparin level 0.3-0.7 units/ml Monitor platelets by anticoagulation protocol: Yes   Plan:  Continue heparin infusion at 2100 units/hr.  Recheck heparin level in 6 hours to confirm.  CBC daily while on heparin.   41, PharmD, [facility] 12/01/2021 4:24 AM

## 2021-12-03 ENCOUNTER — Emergency Department: Payer: 59

## 2021-12-03 ENCOUNTER — Encounter: Payer: Self-pay | Admitting: Emergency Medicine

## 2021-12-03 ENCOUNTER — Emergency Department
Admission: EM | Admit: 2021-12-03 | Discharge: 2021-12-03 | Disposition: A | Discharge status: Home / Self Care | Payer: 59 | Attending: Emergency Medicine | Admitting: Emergency Medicine

## 2021-12-03 ENCOUNTER — Other Ambulatory Visit: Payer: Self-pay

## 2021-12-03 DIAGNOSIS — Z5321 Procedure and treatment not carried out due to patient leaving prior to being seen by health care provider: Secondary | ICD-10-CM | POA: Diagnosis not present

## 2021-12-03 DIAGNOSIS — R079 Chest pain, unspecified: Secondary | ICD-10-CM | POA: Diagnosis present

## 2021-12-03 LAB — COMPREHENSIVE METABOLIC PANEL
ALT: 50 U/L — ABNORMAL HIGH (ref 0–44)
AST: 39 U/L (ref 15–41)
Albumin: 4.1 g/dL (ref 3.5–5.0)
Alkaline Phosphatase: 80 U/L (ref 38–126)
Anion gap: 7 (ref 5–15)
BUN: 12 mg/dL (ref 6–20)
CO2: 25 mmol/L (ref 22–32)
Calcium: 9 mg/dL (ref 8.9–10.3)
Chloride: 106 mmol/L (ref 98–111)
Creatinine, Ser: 1.11 mg/dL (ref 0.61–1.24)
GFR, Estimated: >60 mL/min (ref >60)
Glucose, Bld: 122 mg/dL — ABNORMAL HIGH (ref 70–99)
Potassium: 3.8 mmol/L (ref 3.5–5.1)
Sodium: 138 mmol/L (ref 135–145)
Total Bilirubin: 0.5 mg/dL (ref 0.3–1.2)
Total Protein: 7.2 g/dL (ref 6.5–8.1)

## 2021-12-03 LAB — CBC WITH DIFFERENTIAL/PLATELET
Abs Immature Granulocytes: 0.04 10*3/uL (ref 0.00–0.07)
Basophils Absolute: 0.1 10*3/uL (ref 0.0–0.1)
Basophils Relative: 1 %
Eosinophils Absolute: 0.3 10*3/uL (ref 0.0–0.5)
Eosinophils Relative: 2 %
HCT: 46 % (ref 39.0–52.0)
Hemoglobin: 15.4 g/dL (ref 13.0–17.0)
Immature Granulocytes: 0 %
Lymphocytes Relative: 24 %
Lymphs Abs: 3.1 10*3/uL (ref 0.7–4.0)
MCH: 28.7 pg (ref 26.0–34.0)
MCHC: 33.5 g/dL (ref 30.0–36.0)
MCV: 85.7 fL (ref 80.0–100.0)
Monocytes Absolute: 0.9 10*3/uL (ref 0.1–1.0)
Monocytes Relative: 7 %
Neutro Abs: 8.3 10*3/uL — ABNORMAL HIGH (ref 1.7–7.7)
Neutrophils Relative %: 66 %
Platelets: 300 10*3/uL (ref 150–400)
RBC: 5.37 MIL/uL (ref 4.22–5.81)
RDW: 13.2 % (ref 11.5–15.5)
WBC: 12.7 10*3/uL — ABNORMAL HIGH (ref 4.0–10.5)
nRBC: 0 % (ref 0.0–0.2)

## 2021-12-03 LAB — LIPASE, BLOOD: Lipase: 56 U/L — ABNORMAL HIGH (ref 11–51)

## 2021-12-03 LAB — TROPONIN I (HIGH SENSITIVITY): Troponin I (High Sensitivity): 77 ng/L — ABNORMAL HIGH (ref <18)

## 2021-12-03 NOTE — ED Triage Notes (Signed)
First Nurse Note:  Arrives via [facility] for C?O CP radiating down left arm.  324 ASA given PTA.  NSR 60's 136/70 1 spray NTG with relief.  CBG 142

## 2021-12-03 NOTE — ED Triage Notes (Signed)
Patient to ED for left sided CP with left are numbness. Seen here for same on Monday- was admitted for 2 days. Patient ambulatory in triage. NAD noted.

## 2021-12-03 NOTE — ED Provider Triage Note (Signed)
Emergency Medicine Provider Triage Evaluation Note  Brandon Hebert. , a 41 y.o. male  was evaluated in triage.  With history of unstable angina, presents to the emergency department with acute on chronic chest pain with associated shortness of breath.  Patient was seeing his cardiologist today when chest pain started and they referred him to the emergency department.  Patient was admitted on January 10 for similar symptoms.  Review of Systems  Positive: Patient has chest pain and SOB.  Negative: No nausea, vomiting or abdominal pain.   Physical Exam  BP 117/73 (BP Location: Right Arm)    Pulse 70    Temp 98.7 F (37.1 C) (Oral)    Resp 18    SpO2 97%  Gen:   Awake, no distress   Resp:  Normal effort  MSK:   Moves extremities without difficulty  Other:    Medical Decision Making  Medically screening exam initiated at 5:34 PM.  Appropriate orders placed.  [name]. was informed that the remainder of the evaluation will be completed by another provider, this initial triage assessment does not replace that evaluation, and the importance of remaining in the ED until their evaluation is complete.     Pia Mau [place], [place] 12/03/21 1735

## 2021-12-03 NOTE — ED Notes (Signed)
IV removed from left hand

## 2021-12-27 ENCOUNTER — Telehealth: Payer: Self-pay | Admitting: Emergency Medicine

## 2021-12-27 NOTE — Telephone Encounter (Signed)
Called patient due to left emergency department before provider exam to inquire about condition and follow up plans. Says he has been seeing Dr. Welton Flakes already.

## 2022-08-09 ENCOUNTER — Ambulatory Visit
Admission: EM | Admit: 2022-08-09 | Discharge: 2022-08-09 | Disposition: A | Discharge status: Home / Self Care | Payer: 59 | Attending: Emergency Medicine | Admitting: Emergency Medicine

## 2022-08-09 ENCOUNTER — Encounter: Payer: Self-pay | Admitting: Emergency Medicine

## 2022-08-09 DIAGNOSIS — L089 Local infection of the skin and subcutaneous tissue, unspecified: Secondary | ICD-10-CM

## 2022-08-09 DIAGNOSIS — L723 Sebaceous cyst: Secondary | ICD-10-CM

## 2022-08-09 MED ORDER — DOXYCYCLINE HYCLATE 100 MG PO CAPS: 100.0000 mg | ORAL_CAPSULE | Freq: Two times a day (BID) | ORAL | 0 refills | Status: DC

## 2022-08-09 NOTE — ED Provider Notes (Signed)
[facility]    CSN: [id number] Arrival date & time: 08/09/22  0802      History   Chief Complaint Chief Complaint  Patient presents with   Cyst    HPI Brandon Hebert. is a 41 y.o. male.   HPI  41 year old male here for evaluation of skin complaint.  Patient reports that he has been experiencing a swollen, tender, red area in the middle of his back for the past week.  He and his significant other attempted to drain the area last night by piercing with a piercing needle but they were unsuccessful.  He denies any fever or drainage.  Past Medical History:  Diagnosis Date   Abscess of neck    Psoriasis    Smoker     Patient Active Problem List   Diagnosis Date Noted   Unstable angina (HCC) 11/30/2021   Tobacco use disorder 11/30/2021   HLD (hyperlipidemia) 11/30/2021   HTN (hypertension) 11/30/2021   Family history of early CAD 11/30/2021    Past Surgical History:  Procedure Laterality Date   APPENDECTOMY         Home Medications    Prior to Admission medications   Medication Sig Start Date End Date Taking? Authorizing Provider  allopurinol (ZYLOPRIM) 100 MG tablet Take 1 tablet by mouth daily.   Yes [provider]  amLODipine (NORVASC) 5 MG tablet Take 1 tablet by mouth daily. 10/29/21  Yes [provider]  atorvastatin (LIPITOR) 20 MG tablet Take 1 tablet (20 mg total) by mouth daily. 12/02/21 08/09/22 Yes Charise Killian, MD  Cholecalciferol 50 MCG (2000 UT) CAPS Take 1 capsule by mouth daily. 10/29/21  Yes [provider]  colchicine 0.6 MG tablet 2 tabs po x 1, then one tab po 1 hour later 10/06/20  Yes Domenick Gong, MD  cyanocobalamin 1000 MCG tablet Take 1 tablet by mouth daily. 10/29/21  Yes [provider]  doxycycline (VIBRAMYCIN) 100 MG capsule Take 1 capsule (100 mg total) by mouth 2 (two) times daily. 08/09/22  Yes Becky Augusta, NP  metoprolol succinate (TOPROL XL) 50 MG 24 hr tablet Take 1  tablet (50 mg total) by mouth daily. Take with or immediately following a meal. 12/01/21 08/09/22 Yes Charise Killian, MD    Family History Family History  Problem Relation Age of Onset   Hypertension Mother    Heart attack Father 13   Cancer Neg Hx     Social History Social History   Tobacco Use   Smoking status: Every Day    Packs/day: 0.20    Years: 20.00    Total pack years: 4.00    Types: Cigarettes   Smokeless tobacco: Current    Types: Snuff  Vaping Use   Vaping Use: Never used  Substance Use Topics   Alcohol use: Yes    Comment: social   Drug use: No     Allergies   Codeine, Penicillins, and Solanum lycopersicum [tomato]   Review of Systems Review of Systems  Constitutional:  Negative for fever.  Skin:  Positive for color change.     Physical Exam Triage Vital Signs ED Triage Vitals  Enc Vitals Group     BP      Pulse      Resp      Temp      Temp src      SpO2      Weight      Height  Head Circumference      Peak Flow      Pain Score      Pain Loc      Pain Edu?      Excl. in GC?    No data found.  Updated Vital Signs BP (!) 125/101 (BP Location: Left Arm)   Pulse 83   Temp 98.3 F (36.8 C) (Oral)   Resp 16   SpO2 96%   Visual Acuity Right Eye Distance:   Left Eye Distance:   Bilateral Distance:    Right Eye Near:   Left Eye Near:    Bilateral Near:     Physical Exam Vitals and nursing note reviewed.  Constitutional:      Appearance: Normal appearance. He is not ill-appearing.  Skin:    General: Skin is warm and dry.     Capillary Refill: Capillary refill takes less than 2 seconds.     Findings: Erythema and lesion present.  Neurological:     General: No focal deficit present.     Mental Status: He is alert and oriented to person, place, and time.  Psychiatric:        Mood and Affect: Mood normal.        Behavior: Behavior normal.        Thought Content: Thought content normal.        Judgment: Judgment  normal.      UC Treatments / Results  Labs (all labs ordered are listed, but only abnormal results are displayed) Labs Reviewed - No data to display  EKG   Radiology No results found.  Procedures Procedures (including critical care time)  Medications Ordered in UC Medications - No data to display  Initial Impression / Assessment and Plan / UC Course  I have reviewed the triage vital signs and the nursing notes.  Pertinent labs & imaging results that were available during my care of the patient were reviewed by me and considered in my medical decision making (see chart for details).   Patient is a very pleasant, nontoxic-appearing 41 year old male here for evaluation of a 4 cm erythematous, raised, tender area to the upper right and central back that has been present for the last week.    The area is erythematous, tender, warm, and indurated.  There is no fluctuance present.  There is a scab from where the piercing going in but no other evidence of the lesion coming to ahead.  I suspect that this is an infected sebaceous cyst.  I will start the patient on doxycycline and I have encouraged him to apply continue warm compresses to the area to see if he can get it to come to ahead and express on its own.  I indicated that if the lesion does not rupture on its own but develops increased swollen or spongy area he could return and we could reevaluate possible I&D.  Work note provided.   Final Clinical Impressions(s) / UC Diagnoses   Final diagnoses:  Infected sebaceous cyst     Discharge Instructions      Take the Doxycycline twice daily with food for 10 days.  Doxycycline will make you more sensitive to sunburn so wear sunscreen when outdoors and reapply it every 90 minutes.  Apply warm compresses to help promote drainage.  Use OTC Tylenol and Ibuprofen according to the package instructions as needed for pain.  Return for new or worsening symptoms.       ED  Prescriptions  Medication Sig Dispense Auth. Provider   doxycycline (VIBRAMYCIN) 100 MG capsule Take 1 capsule (100 mg total) by mouth 2 (two) times daily. 20 capsule Margarette Canada, NP      PDMP not reviewed this encounter.   Margarette Canada, NP 08/09/22 [phone]

## 2022-08-09 NOTE — ED Triage Notes (Signed)
Pt presents with a cyst in the middle of his back for 1 week

## 2022-08-09 NOTE — Discharge Instructions (Signed)
Take the Doxycycline twice daily with food for 10 days.  Doxycycline will make you more sensitive to sunburn so wear sunscreen when outdoors and reapply it every 90 minutes.  Apply warm compresses to help promote drainage.  Use OTC Tylenol and Ibuprofen according to the package instructions as needed for pain.  Return for new or worsening symptoms.   

## 2023-01-31 ENCOUNTER — Ambulatory Visit: Payer: 59 | Admitting: Cardiovascular Disease

## 2023-02-01 ENCOUNTER — Other Ambulatory Visit: Payer: Self-pay

## 2023-02-01 DIAGNOSIS — N50819 Testicular pain, unspecified: Secondary | ICD-10-CM

## 2023-02-02 ENCOUNTER — Ambulatory Visit: Payer: 59 | Admitting: Cardiovascular Disease

## 2023-02-02 ENCOUNTER — Encounter: Payer: Self-pay | Admitting: Cardiovascular Disease

## 2023-02-02 VITALS — BP 130/80 | HR 102 | Ht 75.0 in | Wt 327.4 lb

## 2023-02-02 DIAGNOSIS — F17200 Nicotine dependence, unspecified, uncomplicated: Secondary | ICD-10-CM

## 2023-02-02 DIAGNOSIS — F17210 Nicotine dependence, cigarettes, uncomplicated: Secondary | ICD-10-CM | POA: Diagnosis not present

## 2023-02-02 DIAGNOSIS — I10 Essential (primary) hypertension: Secondary | ICD-10-CM

## 2023-02-02 DIAGNOSIS — E782 Mixed hyperlipidemia: Secondary | ICD-10-CM | POA: Diagnosis not present

## 2023-02-02 DIAGNOSIS — Z6841 Body Mass Index (BMI) 40.0 and over, adult: Secondary | ICD-10-CM

## 2023-02-02 MED ORDER — ATORVASTATIN CALCIUM 20 MG PO TABS: 20.0000 mg | ORAL_TABLET | Freq: Every day | ORAL | 6 refills | Status: AC

## 2023-02-02 NOTE — Assessment & Plan Note (Signed)
LDL 106, continue atorvastatin 20 mg daily.

## 2023-02-02 NOTE — Assessment & Plan Note (Signed)
Prescribed Chantix by PCP, started medication today.

## 2023-02-02 NOTE — Progress Notes (Signed)
Cardiology Office Note   Date:  02/02/2023   ID:  Brandon Safer., DOB 1981/06/10, MRN [id number]  PCP:  [facility]  Cardiologist:  Neoma Laming, MD      History of Present Illness: Brandon Haston. is a 42 y.o. male who presents for  Chief Complaint  Patient presents with   Follow-up    6 month follow up    Patient in office for routine cardiac exam. Denies chest pain, shortness of breath, edema, palpitations. Started Chantix today.      Past Medical History:  Diagnosis Date   Abscess of neck    Psoriasis    Smoker      Past Surgical History:  Procedure Laterality Date   APPENDECTOMY      Current Outpatient Medications  Medication Sig Dispense Refill   amLODipine (NORVASC) 5 MG tablet Take 1 tablet by mouth daily.     Cholecalciferol 50 MCG (2000 UT) CAPS Take 1 capsule by mouth 1 day or 1 dose.     cyanocobalamin (VITAMIN B12) 1000 MCG tablet Take 1,000 mcg by mouth daily.     solifenacin (VESICARE) 5 MG tablet Take 5 mg by mouth daily.     varenicline (CHANTIX) 0.5 MG tablet Take 0.5 mg by mouth 2 (two) times daily.     atorvastatin (LIPITOR) 20 MG tablet Take 1 tablet (20 mg total) by mouth daily. 30 tablet 6   No current facility-administered medications for this visit.    Allergies:   Codeine, Penicillins, and Solanum lycopersicum [tomato]    Social History:   reports that he has been smoking cigarettes. He has a 4.00 pack-year smoking history. His smokeless tobacco use includes snuff. He reports current alcohol use. He reports that he does not use drugs.   Family History:  family history includes Heart attack (age of onset: 78) in his father; Hypertension in his mother.    ROS:     Review of Systems  Constitutional: Negative.   HENT: Negative.    Eyes: Negative.   Respiratory: Negative.    Cardiovascular: Negative.   Gastrointestinal: Negative.   Genitourinary: Negative.   Musculoskeletal: Negative.   Skin: Negative.    Neurological: Negative.   Endo/Heme/Allergies: Negative.   Psychiatric/Behavioral: Negative.    All other systems reviewed and are negative.   All other systems are reviewed and negative.   PHYSICAL EXAM: VS:  BP 130/80   Pulse (!) 102   Ht '6\' 3"'$  (1.905 m)   Wt (!) 327 lb 6.4 oz (148.5 kg)   SpO2 97%   BMI 40.92 kg/m  , BMI Body mass index is 40.92 kg/m. Last weight:  Wt Readings from Last 3 Encounters:  02/02/23 (!) 327 lb 6.4 oz (148.5 kg)  12/03/21 (!) 316 lb (143.3 kg)  11/29/21 (!) 312 lb (141.5 kg)   Physical Exam Vitals reviewed.  Constitutional:      Appearance: Normal appearance. He is normal weight.  HENT:     Head: Normocephalic.     Nose: Nose normal.     Mouth/Throat:     Mouth: Mucous membranes are moist.  Eyes:     Pupils: Pupils are equal, round, and reactive to light.  Cardiovascular:     Rate and Rhythm: Normal rate and regular rhythm.     Pulses: Normal pulses.     Heart sounds: Normal heart sounds.  Pulmonary:     Effort: Pulmonary effort is normal.  Abdominal:  General: Abdomen is flat. Bowel sounds are normal.  Musculoskeletal:        General: Normal range of motion.     Cervical back: Normal range of motion.  Skin:    General: Skin is warm.  Neurological:     General: No focal deficit present.     Mental Status: He is alert.  Psychiatric:        Mood and Affect: Mood normal.     EKG: none today  Recent Labs: No results found for requested labs within last 365 days.    Lipid Panel    Component Value Date/Time   CHOL 209 (H) 11/30/2021 0626   CHOL 176 01/18/2012 0153   TRIG 222 (H) 11/30/2021 0626   TRIG 236 (H) 01/18/2012 0153   HDL 23 (L) 11/30/2021 0626   HDL 19 (L) 01/18/2012 0153   CHOLHDL 9.1 11/30/2021 0626   VLDL 44 (H) 11/30/2021 0626   VLDL 47 (H) 01/18/2012 0153   LDLCALC 142 (H) 11/30/2021 0626   LDLCALC 110 (H) 01/18/2012 0153      Other studies Reviewed: Patient: [id number] - Brandon Hebert DOB:  Mar 25, 1981    Date:  12/16/2021 09:30 Provider: Neoma Laming MD Encounter: ALL ANGIOGRAMS (CTA BRAIN, CAROTIDS, RENAL ARTERIES, PE)   Page 1 REASON FOR VISIT  Referred by Dr.Orene Abbasi Humphrey Rolls.    TESTS  Imaging: Computed Tomographic Angiography:  Cardiac multidetector CT was performed paying particular attention to the coronary arteries for the diagnosis of: R94.30, R07.9. Diagnostic Drugs:  Administered iohexol (Omnipaque) through an antecubital vein and images from the examination were analyzed for the presence and extent of coronary artery disease, using 3D image processing software. 100 mL of non-ionic contrast (Omnipaque) was used.   TEST CONCLUSIONS  Quality of study: Suboptimal/Poor.  1-Calcium score: 0  2-Right dominant system.  3-LAD and LCX are normal, RCA appears normal but difficult to evaluate due to step artifacts.   Neoma Laming MD  Electronically signed by: Neoma Laming     Date: 12/16/2021 14:19  Patient: [id number] - Brandon Safer DOB:  [birth year]    Date:  12/09/2021 13:02 Provider: Neoma Laming MD Encounter: [facility] ([facility]) TEST   Page 1 TESTS   [facility] [address] [place], [place] [address] [phone]   STUDY:  Exercise Stress Test SEX:  Male  REFERRING PHYSICIAN: Dr.Lisbeth Puller Humphrey Rolls                                                                                                                                                                                                                       INDICATION FOR STUDY: CP                                                                                                                                                                                                                     STRESS BY:  Neoma Laming, MD  PROTOCOL:   Bruce                                                                                      MAX PRED HR: 180                     85%: 153               75%: 135  RESTING BP: 112/70  RESTING HR:  78  PEAK BP: 136/80  PEAK HR: 115 (63%)                                                             EXERCISE DURATION: 5:00                                                 METS: 7.0                                                                                                                REASON FOR TEST TERMINATION: Fatigue. CP 5/10. SOB.                                                                                                                                  SYMPTOMS: Fatigue. CP 5/10. SOB.    DUKE TREADMILL SCORE : -5    RISK:  Moderate                                                                                                                                                     EKG RESULTS: NSR. 85/min. 91m horizontal ST depression inferolaterally.  IMPRESSION: Sub maximal stress test due to heart rate limited due to CP, advise nuclear stress test.                                                                                                                                                                                                                        Neoma Laming, MD  Stress Interpreting Physician                  Neoma Laming MD  Electronically signed by: Neoma Laming     Date: 12/10/2021 09:59  Patient: [id number] - Brandon Safer DOB:  [birth year]   Date:  10/13/2020  13:30 Provider: Neoma Laming MD Encounter: ECHO   Page 2 REASON FOR VISIT  Visit for: Echocardiogram/ Essential Hypertension  Sex:   Male         wt= 331   lbs.  BP= 142/96  Height= 76   inches.    TESTS  Imaging: Echocardiogram:  An echocardiogram in (2-d) mode was performed and in Doppler mode with color flow velocity mapping was performed. The aortic valve cusps are abnormal 1.5   cm. Aortic root diameter 3.4   cm. LV systolic dimension [id number]  cm, diastolic [id number]  cm, posterior wall thickness 1.08     cm, fractional shortening 29.6  %, and EF 55.9 %. IVS thickness 0.749   cm. LA dimension 4.6 cm.     ASSESSMENT  Technically difficult study due to body habitus.  Suboptimal study due to poor windows.  Normal chamber sizes.  Normal left ventricular systolic function.  Mild left ventricular hypertrophy with GRADE 1  (relaxation abnormality) diastolic dysfunction.  No pulomonary HTN  Normal right ventricular systolic function.  Normal right ventricular diastolic function.  Right ventricular diastolic dysfunction.  Normal left ventricular wall motion.  Normal right ventricular wall motion.  Normal pulmonary artery pressure.  Trivial anterior pericardial effusion.  Normal left atrium  Normal left ventricle  Normal right atrium   Normal right ventricle  Normal Ao Root  Normal ascending Ao  Normal LVH.   THERAPY   Referring physician: Dionisio David  Sonographer: Neoma Laming.   Neoma Laming MD  Electronically signed by: Neoma Laming     Date: 10/23/2020 09:10  ASSESSMENT AND PLAN:    ICD-10-CM   1. Primary hypertension  I10     2. Mixed hyperlipidemia  E78.2 atorvastatin (LIPITOR) 20 MG tablet    3. Tobacco use disorder  F17.200     4. Morbid obesity with BMI of 40.0-44.9, adult ([place])  E66.01    Z68.41        Problem List Items Addressed This Visit       Cardiovascular and Mediastinum   HTN (hypertension) - Primary    Well controlled. Continue  amlodipine.       Relevant Medications   atorvastatin (LIPITOR) 20 MG tablet     Other   Tobacco use disorder    Prescribed Chantix by PCP, started medication today.       HLD (hyperlipidemia)    LDL 106, continue atorvastatin 20 mg daily.       Relevant Medications   atorvastatin (LIPITOR) 20 MG tablet   Morbid obesity with BMI of 40.0-44.9, adult ([place])    Disposition:   Return in about 6 months (around 08/05/2023).    Total time spent: 30 minutes  Signed,  Neoma Laming, MD  02/02/2023 1:24 [place]

## 2023-02-02 NOTE — Assessment & Plan Note (Signed)
Well controlled. Continue amlodipine 

## 2023-02-03 ENCOUNTER — Ambulatory Visit
Admission: RE | Admit: 2023-02-03 | Discharge: 2023-02-03 | Disposition: A | Discharge status: Home / Self Care | Payer: 59 | Source: Ambulatory Visit | Attending: Gerontology | Admitting: Gerontology

## 2023-02-03 DIAGNOSIS — N50819 Testicular pain, unspecified: Secondary | ICD-10-CM | POA: Insufficient documentation

## 2023-02-28 ENCOUNTER — Other Ambulatory Visit: Payer: Self-pay

## 2023-02-28 ENCOUNTER — Emergency Department: Payer: 59

## 2023-02-28 ENCOUNTER — Emergency Department
Admission: EM | Admit: 2023-02-28 | Discharge: 2023-02-28 | Disposition: A | Discharge status: Home / Self Care | Payer: 59 | Attending: Emergency Medicine | Admitting: Emergency Medicine

## 2023-02-28 DIAGNOSIS — R0789 Other chest pain: Secondary | ICD-10-CM | POA: Diagnosis not present

## 2023-02-28 DIAGNOSIS — R55 Syncope and collapse: Secondary | ICD-10-CM | POA: Insufficient documentation

## 2023-02-28 DIAGNOSIS — R0602 Shortness of breath: Secondary | ICD-10-CM

## 2023-02-28 DIAGNOSIS — Z720 Tobacco use: Secondary | ICD-10-CM | POA: Insufficient documentation

## 2023-02-28 HISTORY — DX: Essential (primary) hypertension: I10

## 2023-02-28 LAB — CBC
HCT: 49.3 % (ref 39.0–52.0)
Hemoglobin: 16.1 g/dL (ref 13.0–17.0)
MCH: 28.3 pg (ref 26.0–34.0)
MCHC: 32.7 g/dL (ref 30.0–36.0)
MCV: 86.6 fL (ref 80.0–100.0)
Platelets: 300 10*3/uL (ref 150–400)
RBC: 5.69 MIL/uL (ref 4.22–5.81)
RDW: 12.8 % (ref 11.5–15.5)
WBC: 12.6 10*3/uL — ABNORMAL HIGH (ref 4.0–10.5)
nRBC: 0 % (ref 0.0–0.2)

## 2023-02-28 LAB — BASIC METABOLIC PANEL
Anion gap: 9 (ref 5–15)
BUN: 19 mg/dL (ref 6–20)
CO2: 20 mmol/L — ABNORMAL LOW (ref 22–32)
Calcium: 8.9 mg/dL (ref 8.9–10.3)
Chloride: 106 mmol/L (ref 98–111)
Creatinine, Ser: 1.17 mg/dL (ref 0.61–1.24)
GFR, Estimated: >60 mL/min (ref >60)
Glucose, Bld: 108 mg/dL — ABNORMAL HIGH (ref 70–99)
Potassium: 3.9 mmol/L (ref 3.5–5.1)
Sodium: 135 mmol/L (ref 135–145)

## 2023-02-28 LAB — TROPONIN I (HIGH SENSITIVITY): Troponin I (High Sensitivity): 3 ng/L (ref <18)

## 2023-02-28 NOTE — ED Provider Notes (Signed)
[facility] Provider Note    Event Date/Time   First MD Initiated Contact with Patient 02/28/23 1313     (approximate)   History   Chest Pain   HPI  Brandon Hebert. is a 42 y.o. male with history of tobacco use who presents after a near syncopal episode.  Patient reports he was sitting on his desk, became lightheaded and briefly felt short of breath with chest tightness, this passed relatively quickly.  He feels well now.  Does report that he is being evaluated for sleep apnea has sleep study scheduled, does see cardiology as well.  He admits to feeling short of breath for some time     Physical Exam   Triage Vital Signs: ED Triage Vitals [02/28/23 1205]  Enc Vitals Group     BP 125/82     Pulse Rate 87     Resp 18     Temp 98 F (36.7 C)     Temp Source Oral     SpO2 99 %     Weight      Height      Head Circumference      Peak Flow      Pain Score 0     Pain Loc      Pain Edu?      Excl. in GC?     Most recent vital signs: Vitals:   02/28/23 1205 02/28/23 1330  BP: 125/82 117/67  Pulse: 87 69  Resp: 18 18  Temp: 98 F (36.7 C)   SpO2: 99% 98%     General: Awake, no distress.  CV:  Good peripheral perfusion.  Resp:  Normal effort.  Clear to auscultation bilaterally Abd:  No distention.  Other:  No lower extremity edema or swelling   ED Results / Procedures / Treatments   Labs (all labs ordered are listed, but only abnormal results are displayed) Labs Reviewed  BASIC METABOLIC PANEL - Abnormal; Notable for the following components:      Result Value   CO2 20 (*)    Glucose, Bld 108 (*)    All other components within normal limits  CBC - Abnormal; Notable for the following components:   WBC 12.6 (*)    All other components within normal limits  TROPONIN I (HIGH SENSITIVITY)  TROPONIN I (HIGH SENSITIVITY)     EKG  ED ECG REPORT I, Jene Every, the attending physician, personally viewed and interpreted this  ECG.  Date: 02/28/2023  Rhythm: normal sinus rhythm QRS Axis: normal Intervals: normal ST/T Wave abnormalities: normal Narrative Interpretation: no evidence of acute ischemia    RADIOLOGY Chest x-ray with possible interstitial fluid    PROCEDURES:  Critical Care performed:   Procedures   MEDICATIONS ORDERED IN ED: Medications - No data to display   IMPRESSION / MDM / ASSESSMENT AND PLAN / ED COURSE  I reviewed the triage vital signs and the nursing notes. Patient's presentation is most consistent with acute presentation with potential threat to life or bodily function.  Patient presents with symptoms as above, question vasovagal episode, he denies palpitations, feels well currently and has no complaints.  Lab work is reassuring, normal high sensitive troponin, EKG is reassuring, chest x-ray with possible mild edema, he does report some chronic shortness of breath potentially related to sleep apnea, he has sleep apnea study scheduled  No indication for admission at this time, will refer him for close outpatient follow-up with his cardiologist, strict  return precautions discussed        FINAL CLINICAL IMPRESSION(S) / ED DIAGNOSES   Final diagnoses:  SOB (shortness of breath)  Near syncope     Rx / DC Orders   ED Discharge Orders     None        Note:  This document was prepared using Dragon voice recognition software and may include unintentional dictation errors.   Jene Every, MD 02/28/23 [phone]

## 2023-02-28 NOTE — ED Notes (Signed)
Pt seen, treated, and discharged by EDP.

## 2023-02-28 NOTE — ED Triage Notes (Signed)
Pt to ED via [facility] from home work. Pt reports sudden onset of CP, SOB and dizziness. EMS gave 324mg  ASA. Pt reports no CP on arrival.

## 2023-02-28 NOTE — ED Notes (Signed)
First Nurse Note: Patient to ED via [facility] from home with CP, SOB, dizziness with standing. Given 324 aspirin. VS WNL

## 2023-02-28 NOTE — ED Notes (Signed)
ED Provider at bedside. 

## 2023-02-28 NOTE — ED Notes (Signed)
Pt reports intermittent episodes of central chest discomfort, SOB, and dizzines x a couple days.  Pt reports "it feels like I can't take a deep breath."  Pt currently denies pain.  Hx of HTN.  Sts compliance w/ medications.

## 2023-08-07 ENCOUNTER — Ambulatory Visit: Payer: 59 | Admitting: Cardiovascular Disease

## 2023-09-14 ENCOUNTER — Ambulatory Visit
Admission: RE | Admit: 2023-09-14 | Discharge: 2023-09-14 | Disposition: A | Discharge status: Home / Self Care | Payer: 59 | Source: Ambulatory Visit | Attending: Gerontology | Admitting: Gerontology

## 2023-09-14 ENCOUNTER — Other Ambulatory Visit: Payer: Self-pay | Admitting: Gerontology

## 2023-09-14 DIAGNOSIS — M79671 Pain in right foot: Secondary | ICD-10-CM

## 2023-09-14 DIAGNOSIS — M7989 Other specified soft tissue disorders: Secondary | ICD-10-CM | POA: Insufficient documentation

## 2023-09-15 ENCOUNTER — Ambulatory Visit
Admission: RE | Admit: 2023-09-15 | Discharge: 2023-09-15 | Disposition: A | Discharge status: Home / Self Care | Payer: 59 | Source: Ambulatory Visit | Attending: Gerontology | Admitting: Gerontology

## 2023-09-15 DIAGNOSIS — M79671 Pain in right foot: Secondary | ICD-10-CM | POA: Diagnosis present

## 2023-09-15 DIAGNOSIS — M7989 Other specified soft tissue disorders: Secondary | ICD-10-CM | POA: Insufficient documentation
